# Patient Record
Sex: Male | Born: 1960 | Race: White | Hispanic: No | Marital: Married | Smoking: Never smoker
Health system: Southern US, Community
[De-identification: ages and names within clinical notes are randomized; demographics above are authoritative.]

## PROBLEM LIST (undated history)

## (undated) DIAGNOSIS — Z8719 Personal history of other diseases of the digestive system: Secondary | ICD-10-CM

## (undated) DIAGNOSIS — I4901 Ventricular fibrillation: Secondary | ICD-10-CM

## (undated) DIAGNOSIS — R0602 Shortness of breath: Secondary | ICD-10-CM

## (undated) DIAGNOSIS — I471 Supraventricular tachycardia, unspecified: Secondary | ICD-10-CM

## (undated) DIAGNOSIS — M1990 Unspecified osteoarthritis, unspecified site: Secondary | ICD-10-CM

## (undated) DIAGNOSIS — I319 Disease of pericardium, unspecified: Secondary | ICD-10-CM

## (undated) DIAGNOSIS — R42 Dizziness and giddiness: Secondary | ICD-10-CM

## (undated) DIAGNOSIS — J811 Chronic pulmonary edema: Secondary | ICD-10-CM

## (undated) DIAGNOSIS — I493 Ventricular premature depolarization: Secondary | ICD-10-CM

## (undated) DIAGNOSIS — K753 Granulomatous hepatitis, not elsewhere classified: Secondary | ICD-10-CM

## (undated) DIAGNOSIS — IMO0002 Reserved for concepts with insufficient information to code with codable children: Secondary | ICD-10-CM

## (undated) HISTORY — PX: TONSILLECTOMY: SUR1361

## (undated) HISTORY — DX: Ventricular premature depolarization: I49.3

## (undated) HISTORY — DX: Supraventricular tachycardia: I47.1

## (undated) HISTORY — PX: EXCISIONAL HEMORRHOIDECTOMY: SHX1541

## (undated) HISTORY — PX: LIVER BIOPSY: SHX301

## (undated) HISTORY — DX: Supraventricular tachycardia, unspecified: I47.10

## (undated) HISTORY — DX: Disease of pericardium, unspecified: I31.9

## (undated) HISTORY — DX: Granulomatous hepatitis, not elsewhere classified: K75.3

## (undated) HISTORY — PX: BAND HEMORRHOIDECTOMY: SHX1213

---

## 2005-10-24 ENCOUNTER — Ambulatory Visit: Payer: Self-pay | Admitting: Family Medicine

## 2006-02-13 ENCOUNTER — Ambulatory Visit: Payer: Self-pay | Admitting: Family Medicine

## 2006-10-24 ENCOUNTER — Encounter: Payer: Self-pay | Admitting: Cardiology

## 2006-10-28 ENCOUNTER — Encounter: Admission: RE | Admit: 2006-10-28 | Discharge: 2006-10-28 | Payer: Self-pay | Admitting: Cardiology

## 2007-09-07 ENCOUNTER — Encounter: Admission: RE | Admit: 2007-09-07 | Discharge: 2007-09-07 | Payer: Self-pay | Admitting: Cardiology

## 2010-03-22 ENCOUNTER — Encounter: Admission: RE | Admit: 2010-03-22 | Discharge: 2010-03-22 | Payer: Self-pay | Admitting: Gastroenterology

## 2010-08-25 ENCOUNTER — Other Ambulatory Visit (HOSPITAL_COMMUNITY): Payer: Self-pay | Admitting: Gastroenterology

## 2010-09-01 ENCOUNTER — Other Ambulatory Visit: Payer: Self-pay | Admitting: Interventional Radiology

## 2010-09-01 ENCOUNTER — Ambulatory Visit (HOSPITAL_COMMUNITY)
Admission: RE | Admit: 2010-09-01 | Discharge: 2010-09-01 | Disposition: A | Discharge status: Home / Self Care | Payer: 59 | Source: Ambulatory Visit | Attending: Gastroenterology | Admitting: Gastroenterology

## 2010-09-01 DIAGNOSIS — K759 Inflammatory liver disease, unspecified: Secondary | ICD-10-CM | POA: Insufficient documentation

## 2010-09-01 DIAGNOSIS — Z01812 Encounter for preprocedural laboratory examination: Secondary | ICD-10-CM | POA: Insufficient documentation

## 2010-09-01 LAB — CBC
MCH: 32.9 pg (ref 26.0–34.0)
MCV: 90.2 fL (ref 78.0–100.0)
Platelets: 153 10*3/uL (ref 150–400)
RBC: 4.99 MIL/uL (ref 4.22–5.81)
RDW: 12.7 % (ref 11.5–15.5)

## 2010-09-01 LAB — PROTIME-INR: Prothrombin Time: 13.3 seconds (ref 11.6–15.2)

## 2010-09-16 ENCOUNTER — Encounter: Payer: Self-pay | Admitting: *Deleted

## 2010-09-16 DIAGNOSIS — R5381 Other malaise: Secondary | ICD-10-CM | POA: Insufficient documentation

## 2010-09-16 DIAGNOSIS — R5383 Other fatigue: Secondary | ICD-10-CM

## 2010-09-16 DIAGNOSIS — Z860100 Personal history of colon polyps, unspecified: Secondary | ICD-10-CM | POA: Insufficient documentation

## 2010-09-16 DIAGNOSIS — I319 Disease of pericardium, unspecified: Secondary | ICD-10-CM | POA: Insufficient documentation

## 2010-09-16 DIAGNOSIS — R945 Abnormal results of liver function studies: Secondary | ICD-10-CM | POA: Insufficient documentation

## 2010-09-21 NOTE — Miscellaneous (Signed)
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 takes one hs prn Added new medication of ALEVE 220 MG TABS (NAPROXEN SODIUM) check dose with pt. takes hs prn Added new medication of MULTIVITAMINS  TABS (MULTIPLE VITAMIN) check formulation with pt. takes one per day Allergies: Added new allergy or adverse reaction of VICODIN (HYDROCODONE-ACETAMINOPHEN) Observations: Added new observation of NKA: F (09/16/2010 14:51)

## 2010-09-28 ENCOUNTER — Other Ambulatory Visit: Payer: Self-pay | Admitting: Infectious Diseases

## 2010-09-28 ENCOUNTER — Ambulatory Visit (INDEPENDENT_AMBULATORY_CARE_PROVIDER_SITE_OTHER): Payer: 59 | Admitting: Infectious Diseases

## 2010-09-28 VITALS — BP 108/73 | HR 70 | Temp 98.0°F | Ht 71.0 in | Wt 166.0 lb

## 2010-09-28 DIAGNOSIS — B009 Herpesviral infection, unspecified: Secondary | ICD-10-CM

## 2010-09-28 DIAGNOSIS — K759 Inflammatory liver disease, unspecified: Secondary | ICD-10-CM

## 2010-09-28 DIAGNOSIS — K753 Granulomatous hepatitis, not elsewhere classified: Secondary | ICD-10-CM

## 2010-09-28 MED ORDER — VALACYCLOVIR HCL 500 MG PO TABS: 500.0000 mg | ORAL_TABLET | Freq: Two times a day (BID) | ORAL | Status: AC

## 2010-09-28 NOTE — Progress Notes (Signed)
  Subjective:    Patient ID: Zachary Lara, male    DOB: 07-01-1961, 50 y.o.   MRN: [id number]  HPI 50 yo M with no PMHx who was seen by gastroenterology in December for decreased level of energy beginning in the summer. Has had excessive fatigue, sleeping during the day and poor sleep at night. He lost 15#. He received a trial of Xifaxan for an increase in his Ammonia, without improvement.  He has been also having difficulty with exhaustion and confusion.  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 He had a CT abd showing "scalloping of the liver", liver hilum prominence, ? Of cirrhosis. He underwent a liver Bx on 09-01-10 which showed non-necrotizing granuloma.     Review of Systems  Constitutional: Positive for activity change and unexpected weight change. Negative for fever, chills and appetite change.  Respiratory: Positive for cough. Negative for shortness of breath.   Gastrointestinal: Negative for diarrhea and constipation.  Genitourinary: Negative for dysuria.   Has 2 dog, raised ducks and chickens. Uncle who raised cattle (he did not work with). Grandfather raised rabbits, lived in a camper in [place] 27 years ago. Had a TB skin test 1 year ago. Had a cousin who died of TB (27 years ago). No new meds.     Objective:   Physical Exam  Constitutional: He appears well-developed and well-nourished. No distress.  Eyes: Pupils are equal, round, and reactive to light. No scleral icterus.  Cardiovascular: Normal rate, regular rhythm and normal heart sounds.   Pulmonary/Chest: No respiratory distress. He has no wheezes. He has no rales.  Abdominal: He exhibits no distension. There is no tenderness. There is no rebound.  Musculoskeletal: He exhibits no edema.  Lymphadenopathy:    He has no cervical adenopathy.  Skin: He is not diaphoretic.             Assessment & Plan:  Multiple potential causes for this. WIll screen him for viral pathogens (CMV, HIV), mycobacteria  (TB, MAI), fungi (Toxo,crypto,  histo), unusual bacteria (Brucella, Q fever), autoimmune causes (ANA, sarcoid). Will see him back in 2 weeks.

## 2010-09-28 NOTE — Progress Notes (Signed)
Addended by: Mariea Clonts on: 09/28/2010 05:04 PM   Modules accepted: Orders

## 2010-09-28 NOTE — Progress Notes (Signed)
Addended by: Starleen Arms on: 09/28/2010 03:53 PM   Modules accepted: Orders

## 2010-09-29 LAB — COMPLETE METABOLIC PANEL WITH GFR
ALT: 44 U/L (ref 0–53)
AST: 33 U/L (ref 0–37)
Albumin: 4.4 g/dL (ref 3.5–5.2)
Alkaline Phosphatase: 69 U/L (ref 39–117)
BUN: 11 mg/dL (ref 6–23)
CO2: 24 mEq/L (ref 19–32)
Calcium: 9.1 mg/dL (ref 8.4–10.5)
Chloride: 99 mEq/L (ref 96–112)
Creat: 1.04 mg/dL (ref 0.40–1.50)
GFR, Est African American: >60 mL/min (ref >60)
GFR, Est Non African American: >60 mL/min (ref >60)
Glucose, Bld: 95 mg/dL (ref 70–99)
Potassium: 4.1 mEq/L (ref 3.5–5.3)
Sodium: 141 mEq/L (ref 135–145)
Total Bilirubin: 0.7 mg/dL (ref 0.3–1.2)
Total Protein: 6.9 g/dL (ref 6.0–8.3)

## 2010-09-29 LAB — CBC WITH DIFFERENTIAL/PLATELET

## 2010-09-29 LAB — ANA: Anti Nuclear Antibody(ANA): POSITIVE — AB

## 2010-09-29 LAB — CRYPTOCOCCAL ANTIGEN

## 2010-09-30 LAB — QUANTIFERON TB GOLD ASSAY (BLOOD)
Interferon Gamma Release Assay: NEGATIVE
Mitogen Minus Nil Value: 7.59 IU/mL
Quantiferon Nil Value: 0.14 IU/mL
TB Antigen Minus Nil Value: 0.06 IU/mL

## 2010-09-30 LAB — HIV-1 RNA QUANT-NO REFLEX-BLD: HIV-1 RNA Quant, Log: <1.3 {Log} (ref <1.30)

## 2010-10-01 ENCOUNTER — Other Ambulatory Visit: Payer: Self-pay | Admitting: Infectious Diseases

## 2010-10-01 LAB — CBC WITH DIFFERENTIAL/PLATELET
Basophils Absolute: 0 10*3/uL (ref 0.0–0.1)
Eosinophils Relative: 12 % — ABNORMAL HIGH (ref 0–5)
HCT: 45 % (ref 39.0–52.0)
Hemoglobin: 16.1 g/dL (ref 13.0–17.0)
Lymphocytes Relative: 32 % (ref 12–46)
Lymphs Abs: 2 10*3/uL (ref 0.7–4.0)
MCV: 91.1 fL (ref 78.0–100.0)
Monocytes Absolute: 0.5 10*3/uL (ref 0.1–1.0)
Monocytes Relative: 7 % (ref 3–12)
Neutro Abs: 3 10*3/uL (ref 1.7–7.7)
RBC: 4.94 MIL/uL (ref 4.22–5.81)
WBC: 6.3 10*3/uL (ref 4.0–10.5)

## 2010-10-02 LAB — BRUCELLA IGG, IGM

## 2010-10-04 LAB — CULTURE, BLOOD (SINGLE): Organism ID, Bacteria: NO GROWTH

## 2010-10-12 ENCOUNTER — Ambulatory Visit: Payer: 59 | Admitting: Internal Medicine

## 2010-10-18 ENCOUNTER — Ambulatory Visit: Payer: 59 | Admitting: Infectious Diseases

## 2010-10-21 ENCOUNTER — Ambulatory Visit (INDEPENDENT_AMBULATORY_CARE_PROVIDER_SITE_OTHER): Payer: 59 | Admitting: Infectious Diseases

## 2010-10-21 ENCOUNTER — Encounter: Payer: Self-pay | Admitting: Infectious Diseases

## 2010-10-21 VITALS — BP 114/77 | HR 71 | Temp 97.7°F | Ht 71.0 in | Wt 166.2 lb

## 2010-10-21 DIAGNOSIS — K759 Inflammatory liver disease, unspecified: Secondary | ICD-10-CM

## 2010-10-21 DIAGNOSIS — K753 Granulomatous hepatitis, not elsewhere classified: Secondary | ICD-10-CM

## 2010-10-21 NOTE — Progress Notes (Signed)
Addended by: Starleen Arms on: 10/21/2010 12:41 PM   Modules accepted: Orders

## 2010-10-21 NOTE — Progress Notes (Signed)
Addended by: Starleen Arms on: 10/21/2010 05:17 PM   Modules accepted: Orders

## 2010-10-21 NOTE — Assessment & Plan Note (Addendum)
His Eos raises a a few different pathogens to the DDx- trichenella (he grew up around pigs), tenia solium/cystercercosis (enzyme linked immunotransfer blot assay), and echinococus. His diagnosis still seems elusive as these last 2 diagnosis tend to have more cystic features than granulomatous features. Will check a stool o & p on him as well. Will see him back in 3 weeks. He and his wife are somewhat stressed as she will undergo bone marrow transplant in the next month.

## 2010-10-21 NOTE — Progress Notes (Signed)
  Subjective:    Patient ID: [name], male    DOB: 1961/02/22, 50 y.o.   MRN: [id number]  HPI 50 yo M with no PMHx who was seen by gastroenterology in December for decreased level of energy beginning in the summer. Has had excessive fatigue, sleeping during the day and poor sleep at night. He lost 15#. He received a trial of Xifaxan for an increase in his Ammonia, without improvement.  He has been also having difficulty with exhaustion and confusion.  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 He had a CT abd showing "scalloping of the liver", liver hilum prominence, ? Of cirrhosis. He underwent a liver Bx on 09-01-10 which showed non-necrotizing granuloma.  He was seen in ID clinic 09-28-10 and had negative tests for HIV, Cyrpto, Toxo, Brucella, Quantiferon gold, Q fever. His ANA was positive but the titer was negative.   He did have notable Eosinophilia (12%).  Has previously traveled to [place] 1 week (2004), [place] 2 weeks (1995), [place] 1 WUJW(1191), [place] 1 day (2000?).   Per his spouse he has had a significant amt of wt loss, was previously a Pharmacist, community. He also has severe fatigue. Has had CT scans which he describes as having "blisters", granulomatous hepatitis. States he has also had a MRI of his brain which showed "dots and spots"   Review of Systems  Gastrointestinal: Positive for constipation.       Objective:   Physical Exam  Constitutional: He appears well-developed. He appears cachectic.          Assessment & Plan:

## 2010-10-22 ENCOUNTER — Other Ambulatory Visit (INDEPENDENT_AMBULATORY_CARE_PROVIDER_SITE_OTHER): Payer: 59 | Admitting: Infectious Diseases

## 2010-10-22 DIAGNOSIS — K759 Inflammatory liver disease, unspecified: Secondary | ICD-10-CM

## 2010-10-22 LAB — ANGIOTENSIN CONVERTING ENZYME: Angiotensin-Converting Enzyme: 41 U/L (ref 8–52)

## 2010-10-22 NOTE — Progress Notes (Signed)
Addended by: Mariea Clonts on: 10/22/2010 12:45 PM   Modules accepted: Orders

## 2010-10-25 LAB — OVA AND PARASITE SCREEN: OP: NONE SEEN

## 2010-10-27 LAB — BARTONELLA ANTIBODY PANEL: B henselae IgM: NEGATIVE

## 2010-11-09 LAB — ECHINOCOCCUS ANTIBODY, IGG: Echinococcus Ab: NEGATIVE

## 2010-11-16 ENCOUNTER — Encounter: Payer: Self-pay | Admitting: Infectious Diseases

## 2010-11-16 ENCOUNTER — Ambulatory Visit (INDEPENDENT_AMBULATORY_CARE_PROVIDER_SITE_OTHER): Payer: 59 | Admitting: Infectious Diseases

## 2010-11-16 DIAGNOSIS — K759 Inflammatory liver disease, unspecified: Secondary | ICD-10-CM

## 2010-11-16 DIAGNOSIS — K753 Granulomatous hepatitis, not elsewhere classified: Secondary | ICD-10-CM

## 2010-11-16 NOTE — Assessment & Plan Note (Signed)
The etiology of his syndrome eludes me. His tests are all negative. I will refer him to [facility] for further eval. Discussed possibility that this could be idiopathic/that we may not know or find the answer to this. Will also querry the [facility] regarding his case.  Will see him back as needed.

## 2010-11-16 NOTE — Progress Notes (Signed)
  Subjective:    Patient ID: Zachary Lara, male    DOB: 10-23-1960, 50 y.o.   MRN: [id number]  HPI 50 yo M with no PMHx who was seen by gastroenterology in December for decreased level of energy beginning in the summer. Has had excessive fatigue, sleeping during the day and poor sleep at night. He lost 15#. He received a trial of Xifaxan for an increase in his Ammonia, without improvement.  He has been also having difficulty with exhaustion and confusion.  Has previously traveled to [place] 1 week (2004), [place] 2 weeks (1995), [place] 1 WUJW(1191), [place] 1 day (2000?).  Per his spouse he has had a significant amt of wt loss, was previously a Pharmacist, community. He also has severe fatigue. Has had CT scans which he describes as having "blisters", granulomatous hepatitis. 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 He had a CT abd showing "scalloping of the liver", liver hilum prominence, ? Of cirrhosis. He underwent a liver Bx on 09-01-10 which showed non-necrotizing granuloma.  He was seen in ID clinic 09-28-10 and had negative tests for HIV, Cyrpto, Toxo, Brucella, Quantiferon gold, Q fever. His ANA was positive but the titer was negative.  He did have notable Eosinophilia (12%).  He returned to ID on 10-21-10 and had Cystercercosis/Echinococcus/Trichinella/B Hensella all negative. As well he had stool O & P(-), CMV IgM (-) and ACE-I (-).  His wt has gone up, has been eating more. No change in his level of fatigue. His wife is pending work up for XRT/CTX/bone marrow txp for NHL.    Review of Systems     Objective:   Physical Exam  Constitutional:  Non-toxic appearance. He has a sickly appearance. No distress.            Assessment & Plan:

## 2010-11-30 ENCOUNTER — Telehealth: Payer: Self-pay | Admitting: *Deleted

## 2010-11-30 NOTE — Telephone Encounter (Signed)
Correct number for [facility] GI referral [phone] [name] CMA

## 2010-11-30 NOTE — Telephone Encounter (Signed)
Referral info faxed to [facility] GI and called to inquire about appt date and time.  Was instructed that patient was left message and he needs to call and schedule the appointment.  Gave patient phone number for Eddie North at [facility] [phone]. [name] CMA

## 2010-12-10 ENCOUNTER — Telehealth: Payer: Self-pay | Admitting: *Deleted

## 2010-12-10 NOTE — Telephone Encounter (Signed)
Patient is scheduled for an appointment at [facility] GI clinic for 12/17/10 at 10:00 AM with Dr. Woodfin Ganja.  Patient notified and given address.  He said he is familiar with [facility] hospital.  Patients [facility] MR # [id number] [name] CMA

## 2011-07-12 HISTORY — PX: LUNG SURGERY: SHX703

## 2011-08-19 HISTORY — PX: OTHER SURGICAL HISTORY: SHX169

## 2011-10-03 ENCOUNTER — Telehealth (INDEPENDENT_AMBULATORY_CARE_PROVIDER_SITE_OTHER): Payer: Self-pay

## 2011-10-03 NOTE — Telephone Encounter (Signed)
Pt calling with recurrent internal hemorrhoids.  He has a history of same.  He would like to be seen sooner than his appt date of 11/02/11 because he is so uncomfortable.  I told him I would forward his request to Dr. Ardine Eng nurse.  He can be reached at [phone].

## 2011-10-06 ENCOUNTER — Ambulatory Visit (INDEPENDENT_AMBULATORY_CARE_PROVIDER_SITE_OTHER): Payer: 59 | Admitting: General Surgery

## 2011-10-06 ENCOUNTER — Encounter (INDEPENDENT_AMBULATORY_CARE_PROVIDER_SITE_OTHER): Payer: Self-pay | Admitting: General Surgery

## 2011-10-06 VITALS — BP 110/78 | HR 92 | Temp 98.5°F | Resp 20 | Ht 71.0 in | Wt 164.2 lb

## 2011-10-06 DIAGNOSIS — K648 Other hemorrhoids: Secondary | ICD-10-CM | POA: Insufficient documentation

## 2011-10-06 NOTE — Patient Instructions (Signed)

## 2011-10-06 NOTE — Progress Notes (Signed)
Patient ID: Zachary Lara, male   DOB: [birth year], 52 y.o.   MRN: [id number]  Chief Complaint  Patient presents with  . Rectal Problems    HPI Zachary Lara is a 51 y.o. male.   HPI 51 year old Caucasian male comes in complaining of new hemorrhoidal problems. Apparently he has had hemorrhoidal problems in the past and has been managed by Dr. Gerrit Friends. He states he has had some hemorrhoids banded in the past as well as an excisional hemorrhoidectomy. He states he's been having some problems for the past week and a half. He normally has a bowel movement every day; however, when his hemorrhoids become engorged his bowel movements become less frequent and more irregular. Also when his hemorrhoid it comes engorged he has sharp severe pain when having a bowel movement. He reports a very severe episode of pain and discomfort last Sunday. He normally does not strain. He normally does not sit on the commode for prolonged periods of time. He denies any incontinence. He has had some bleeding over the past several days. He states that his bowel habits get out of sync when his hemorrhoids become engorged. When this happens his bowels become irregular, he has to strain more, and he has to sit for prolonged periods of time. He reports having a normal colonoscopy within the past several years. He eats a high-fiber diet. Past Medical History  Diagnosis Date  . Granulomatous hepatitis     Past Surgical History  Procedure Date  . Pericardial stripping 08/19/11    [facility]  . Excisional hemorrhoidectomy   . Band hemorrhoidectomy     No family history on file.  Social History History  Substance Use Topics  . Smoking status: Never Smoker   . Smokeless tobacco: Never Used  . Alcohol Use: No    Allergies  Allergen Reactions  . Hydrocodone-Acetaminophen     REACTION: check with pt to see what reax occurs    Current Outpatient Prescriptions  Medication Sig Dispense Refill  . eszopiclone (LUNESTA)  2 MG TABS Take 2 mg by mouth at bedtime. Take immediately before bedtime       . valACYclovir (VALTREX) 500 MG tablet Take 500 mg by mouth 2 (two) times daily.        Review of Systems Review of Systems  Constitutional: Negative for fever, chills, appetite change and unexpected weight change.  HENT: Negative for congestion and trouble swallowing.   Eyes: Negative for visual disturbance.  Respiratory: Negative for chest tightness and shortness of breath.   Cardiovascular: Negative for chest pain and leg swelling.       No PND, no orthopnea, no DOE; had open heart surgery at [facility] clinic on 08/19/11 for pericaridal stripping  Gastrointestinal:       See HPI  Genitourinary: Negative for dysuria and hematuria.  Musculoskeletal: Negative.   Skin: Negative for rash.  Neurological: Negative for seizures and speech difficulty.  Hematological: Does not bruise/bleed easily.  Psychiatric/Behavioral: Negative for behavioral problems and confusion.    Blood pressure 110/78, pulse 92, temperature 98.5 F (36.9 C), temperature source Temporal, resp. rate 20, height 5\' 11"  (1.803 m), weight 164 lb 3.2 oz (74.481 kg).  Physical Exam Physical Exam  Vitals reviewed. Constitutional: He is oriented to person, place, and time. He appears well-developed and well-nourished.  HENT:  Head: Normocephalic and atraumatic.  Right Ear: External ear normal.  Left Ear: External ear normal.  Eyes: Conjunctivae are normal. No scleral icterus.  Neck: Normal range  of motion. Neck supple. No JVD present. No tracheal deviation present. No thyromegaly present.  Cardiovascular: Normal rate, regular rhythm and normal heart sounds.   Pulmonary/Chest: Effort normal. No respiratory distress. He has no wheezes.    Abdominal: Soft. Bowel sounds are normal. He exhibits no distension. There is no tenderness.  Genitourinary: Rectal exam shows anal tone normal.       Doesn't really have any prominent ext hemorrhoids. ?sentinel  pile in post midline.-no anal fissure.  Good tone Anoscopy- Rt ant - grade 1 hemorrhoid; rt post - small grade 1 hemorrhoid, left lateral - well healed scar -? Prior excisional hemorrhoid site Has some well healed mucosal scars from what appears to be prior banding.   Musculoskeletal: Normal range of motion. He exhibits no edema and no tenderness.  Neurological: He is alert and oriented to person, place, and time. He exhibits normal muscle tone.  Skin: Skin is warm and dry. No rash noted. He is not diaphoretic. No erythema.  Psychiatric: He has a normal mood and affect. His behavior is normal. Judgment and thought content normal.    Data Reviewed Dr Moshe Cipro note from 1 yr ago  Assessment    Bleeding internal hemorrhoids    Plan    We discussed the etiology of hemorrhoids. The patient was given educational material as well as diagrams. We discussed nonoperative and operative management of hemorrhoidal disease.  We discussed the importance of having a daily soft bowel movement and avoiding constipation. We also discussed good bowel habits such as not reading in the bathroom, not straining, and drinking 6-8 glasses of water per day. We also discussed the importance of a high fiber diet. We discussed foods that were high in fiber as well as fiber supplements. We discussed the importance of trying to get 25-30 g of fiber per day in their diet. We discussed the need to start with a low dose of fiber and then gradually increasing their daily fiber dose over several weeks in order to avoid bloating and cramping.  We then discussed different surgical techniques for hemorrhoids, specifically hemorrhoidal banding and excisional hemorrhoidectomy.  He has pretty good bowel & bathroom habits so he wishes to proceed to the OR  PLAN: proceed to OR for EUA, hemorrhoidal banding &/or excisional hemorrhoidectomy    I discussed the procedure in detail.  The patient was given Agricultural engineer.  We  discussed the risks and benefits of surgery including, but not limited to bleeding, infection, blood clot formation, anesthesia risk, urinary retention, hemorrhoid recurrence, injury to the sphincters resulting in incontinence, and the rare possibility of anal canal narrowing. I explained that the likelihood of improvement of their symptoms is good  We discussed the typical postoperative course.  I stressed the importance of not becoming constipated after surgery.  The patient was encouraged to limit pain medication if possible as this increases the likelihood of becoming constipated. The patient was advised to take stool softners & drink 8-10 glasses of non-carbonated, non-alcoholic beverages per day and to eat a high fiber diet.  I also encouraged soaking in a water warm bath for 15 minutes at a time several times a day and after a bowel movement.  The patient was advised to take laxatives such as milk of magnesia or Miralax if no bowel movement three days after surgery.  The patient was advised to expect some blood tinged drainage as well as some blood in their bowel movements.   He is seeing his [facility] cardiologist this  Tuesday. We will request clearance for surgery.   Mary Sella. Andrey Campanile, MD, FACS General, Bariatric, & Minimally Invasive Surgery [facility], [place]        [facility] M 10/06/2011, 3:30 PM

## 2011-10-14 ENCOUNTER — Encounter (HOSPITAL_COMMUNITY): Payer: Self-pay | Admitting: Pharmacy Technician

## 2011-10-14 ENCOUNTER — Encounter: Payer: Self-pay | Admitting: *Deleted

## 2011-10-14 ENCOUNTER — Other Ambulatory Visit (INDEPENDENT_AMBULATORY_CARE_PROVIDER_SITE_OTHER): Payer: Self-pay | Admitting: General Surgery

## 2011-10-14 DIAGNOSIS — I319 Disease of pericardium, unspecified: Secondary | ICD-10-CM | POA: Insufficient documentation

## 2011-10-17 ENCOUNTER — Encounter (HOSPITAL_COMMUNITY)
Admission: RE | Admit: 2011-10-17 | Discharge: 2011-10-17 | Disposition: A | Discharge status: Home / Self Care | Payer: 59 | Source: Ambulatory Visit | Attending: General Surgery | Admitting: General Surgery

## 2011-10-17 ENCOUNTER — Encounter (HOSPITAL_COMMUNITY): Payer: Self-pay

## 2011-10-17 HISTORY — DX: Unspecified osteoarthritis, unspecified site: M19.90

## 2011-10-17 HISTORY — DX: Ventricular fibrillation: I49.01

## 2011-10-17 HISTORY — DX: Chronic pulmonary edema: J81.1

## 2011-10-17 HISTORY — DX: Dizziness and giddiness: R42

## 2011-10-17 HISTORY — DX: Personal history of other diseases of the digestive system: Z87.19

## 2011-10-17 HISTORY — DX: Shortness of breath: R06.02

## 2011-10-17 HISTORY — DX: Reserved for concepts with insufficient information to code with codable children: IMO0002

## 2011-10-17 LAB — CBC
HCT: 47.8 % (ref 39.0–52.0)
Hemoglobin: 16.5 g/dL (ref 13.0–17.0)
RBC: 5.29 MIL/uL (ref 4.22–5.81)

## 2011-10-17 LAB — DIFFERENTIAL
Lymphocytes Relative: 20 % (ref 12–46)
Lymphs Abs: 1.4 10*3/uL (ref 0.7–4.0)
Monocytes Absolute: 0.7 10*3/uL (ref 0.1–1.0)
Monocytes Relative: 10 % (ref 3–12)
Neutro Abs: 4.3 10*3/uL (ref 1.7–7.7)
Neutrophils Relative %: 61 % (ref 43–77)

## 2011-10-17 LAB — SURGICAL PCR SCREEN
MRSA, PCR: NEGATIVE
Staphylococcus aureus: NEGATIVE

## 2011-10-17 LAB — BASIC METABOLIC PANEL
Chloride: 98 mEq/L (ref 96–112)
GFR calc Af Amer: 81 mL/min — ABNORMAL LOW (ref >90)
GFR calc non Af Amer: 70 mL/min — ABNORMAL LOW (ref >90)
Glucose, Bld: 93 mg/dL (ref 70–99)
Potassium: 4.1 mEq/L (ref 3.5–5.1)
Sodium: 137 mEq/L (ref 135–145)

## 2011-10-17 NOTE — Patient Instructions (Addendum)
20 NOCHOLAS DAMASO  10/17/2011   Your procedure is scheduled on:  10/28/11  Report to SHORT STAY DEPT  at 8:30 AM.  Call this number if you have problems the morning of surgery: [phone]   Remember:   Do not eat food or drink liquids AFTER MIDNIGHT  May have clear liquids UNTIL 6 HOURS BEFORE SURGERY  Clear liquids include soda, tea, black coffee, apple or grape juice, broth.  Take these medicines the morning of surgery with A SIP OF WATER: OMEPRAZOLE / VALTREX   Do not wear jewelry, make-up or nail polish.  Do not wear lotions, powders, or perfumes.   Do not shave legs or underarms 48 hrs. before surgery (men may shave face)  Do not bring valuables to the hospital.  Contacts, dentures or bridgework may not be worn into surgery.  Leave suitcase in the car. After surgery it may be brought to your room.  For patients admitted to the hospital, checkout time is 11:00 AM the day of discharge.   Patients discharged the day of surgery will not be allowed to drive home.  Name and phone number of your driver:   Special Instructions:   Please read over the following fact sheets that you were given: MRSA  Information               SHOWER WITH BETASEPT THE NIGHT BEFORE SURGERY AND THE MORNING OF SURGERY                USE FLEET ENEMA THE MORNING OF SURGERY BEFORE COMING TO HOSPITAL

## 2011-10-20 ENCOUNTER — Ambulatory Visit (HOSPITAL_COMMUNITY): Payer: 59

## 2011-10-24 ENCOUNTER — Ambulatory Visit (HOSPITAL_COMMUNITY): Payer: 59

## 2011-10-26 ENCOUNTER — Ambulatory Visit (HOSPITAL_COMMUNITY): Payer: 59

## 2011-10-27 NOTE — Pre-Procedure Instructions (Signed)
Notes from [facility] and [facility] on chart  CXR done post thorantesis 10/26/11 reviewed by Dr. Shireen Quan - no repeat needed I spoke with pt per phone - he states Dr. Lurline Idol is aware of planned procedure and is cleared Pt is doing well (was walking dogs at time of conversation)

## 2011-10-28 ENCOUNTER — Encounter (HOSPITAL_COMMUNITY): Payer: Self-pay | Admitting: Anesthesiology

## 2011-10-28 ENCOUNTER — Ambulatory Visit (HOSPITAL_COMMUNITY)
Admission: RE | Admit: 2011-10-28 | Discharge: 2011-10-28 | Disposition: A | Discharge status: Home / Self Care | Payer: 59 | Source: Ambulatory Visit | Attending: General Surgery | Admitting: General Surgery

## 2011-10-28 ENCOUNTER — Ambulatory Visit (HOSPITAL_COMMUNITY): Payer: 59

## 2011-10-28 ENCOUNTER — Ambulatory Visit (HOSPITAL_COMMUNITY): Payer: 59 | Admitting: Anesthesiology

## 2011-10-28 ENCOUNTER — Encounter (HOSPITAL_COMMUNITY)
Admission: RE | Disposition: A | Discharge status: Home / Self Care | Payer: Self-pay | Source: Ambulatory Visit | Attending: General Surgery

## 2011-10-28 ENCOUNTER — Encounter (HOSPITAL_COMMUNITY): Payer: Self-pay | Admitting: *Deleted

## 2011-10-28 DIAGNOSIS — Z79899 Other long term (current) drug therapy: Secondary | ICD-10-CM | POA: Insufficient documentation

## 2011-10-28 DIAGNOSIS — K648 Other hemorrhoids: Secondary | ICD-10-CM | POA: Insufficient documentation

## 2011-10-28 DIAGNOSIS — Z01812 Encounter for preprocedural laboratory examination: Secondary | ICD-10-CM | POA: Insufficient documentation

## 2011-10-28 SURGERY — EXAM UNDER ANESTHESIA WITH HEMORRHOIDECTOMY
Anesthesia: General | Site: Anus | Wound class: Contaminated

## 2011-10-28 MED ORDER — OXYCODONE-ACETAMINOPHEN 5-325 MG PO TABS: 1.0000 | ORAL_TABLET | ORAL | Status: AC | PRN

## 2011-10-28 MED ORDER — FENTANYL CITRATE 0.05 MG/ML IJ SOLN
INTRAMUSCULAR | Status: DC | PRN
  Administered 2011-10-28 (×3): 50 ug via INTRAVENOUS

## 2011-10-28 MED ORDER — CEFOXITIN SODIUM 1 G IV SOLR
1.0000 g | INTRAVENOUS | Status: AC
  Administered 2011-10-28: 1 g via INTRAVENOUS
  Filled 2011-10-28: qty 1

## 2011-10-28 MED ORDER — BUPIVACAINE LIPOSOME 1.3 % IJ SUSP
20.0000 mL | Freq: Once | INTRAMUSCULAR | Status: AC
  Administered 2011-10-28: 20 mL
  Filled 2011-10-28: qty 20

## 2011-10-28 MED ORDER — BUPIVACAINE-EPINEPHRINE 0.25% -1:200000 IJ SOLN
INTRAMUSCULAR | Status: DC | PRN
  Administered 2011-10-28: 4 mL

## 2011-10-28 MED ORDER — PROPOFOL 10 MG/ML IV EMUL
INTRAVENOUS | Status: DC | PRN
  Administered 2011-10-28: 150 mg via INTRAVENOUS

## 2011-10-28 MED ORDER — FLEET ENEMA 7-19 GM/118ML RE ENEM: 1.0000 | ENEMA | Freq: Once | RECTAL | Status: DC

## 2011-10-28 MED ORDER — 0.9 % SODIUM CHLORIDE (POUR BTL) OPTIME
TOPICAL | Status: DC | PRN
  Administered 2011-10-28: 1000 mL

## 2011-10-28 MED ORDER — MIDAZOLAM HCL 5 MG/5ML IJ SOLN
INTRAMUSCULAR | Status: DC | PRN
  Administered 2011-10-28: 2 mg via INTRAVENOUS

## 2011-10-28 MED ORDER — LACTATED RINGERS IV SOLN
INTRAVENOUS | Status: DC
  Administered 2011-10-28: 1000 mL via INTRAVENOUS

## 2011-10-28 MED ORDER — BUPIVACAINE-EPINEPHRINE PF 0.25-1:200000 % IJ SOLN
INTRAMUSCULAR | Status: AC
  Filled 2011-10-28: qty 30

## 2011-10-28 SURGICAL SUPPLY — 39 items
BLADE SURG 15 STRL LF DISP TIS (BLADE) ×2 IMPLANT
BLADE SURG 15 STRL SS (BLADE) ×1
BLADE SURG SZ10 CARB STEEL (BLADE) IMPLANT
BRIEF STRETCH FOR OB PAD LRG (UNDERPADS AND DIAPERS) ×3 IMPLANT
CANISTER SUCTION 2500CC (MISCELLANEOUS) ×3 IMPLANT
CLOTH BEACON ORANGE TIMEOUT ST (SAFETY) ×3 IMPLANT
COVER SURGICAL LIGHT HANDLE (MISCELLANEOUS) IMPLANT
DECANTER SPIKE VIAL GLASS SM (MISCELLANEOUS) IMPLANT
DRAPE LAPAROTOMY T 102X78X121 (DRAPES) ×3 IMPLANT
DRAPE LG THREE QUARTER DISP (DRAPES) IMPLANT
DRSG PAD ABDOMINAL 8X10 ST (GAUZE/BANDAGES/DRESSINGS) ×3 IMPLANT
ELECT NEEDLE TIP 2.8 STRL (NEEDLE) IMPLANT
ELECT REM PT RETURN 9FT ADLT (ELECTROSURGICAL) ×3
ELECTRODE REM PT RTRN 9FT ADLT (ELECTROSURGICAL) ×2 IMPLANT
GAUZE SPONGE 4X4 16PLY XRAY LF (GAUZE/BANDAGES/DRESSINGS) ×3 IMPLANT
GLOVE BIO SURGEON STRL SZ7.5 (GLOVE) ×3 IMPLANT
GLOVE BIOGEL M STRL SZ7.5 (GLOVE) IMPLANT
GLOVE BIOGEL PI IND STRL 7.0 (GLOVE) ×2 IMPLANT
GLOVE BIOGEL PI INDICATOR 7.0 (GLOVE) ×1
GLOVE INDICATOR 8.0 STRL GRN (GLOVE) ×3 IMPLANT
GOWN STRL NON-REIN LRG LVL3 (GOWN DISPOSABLE) ×3 IMPLANT
GOWN STRL REIN XL XLG (GOWN DISPOSABLE) ×6 IMPLANT
KIT BASIN OR (CUSTOM PROCEDURE TRAY) ×3 IMPLANT
LUBRICANT JELLY K Y 4OZ (MISCELLANEOUS) ×3 IMPLANT
NDL SAFETY ECLIPSE 18X1.5 (NEEDLE) IMPLANT
NEEDLE HYPO 18GX1.5 SHARP (NEEDLE)
NEEDLE HYPO 25X1 1.5 SAFETY (NEEDLE) ×6 IMPLANT
NS IRRIG 1000ML POUR BTL (IV SOLUTION) ×3 IMPLANT
PACK BASIC VI WITH GOWN DISP (CUSTOM PROCEDURE TRAY) ×3 IMPLANT
PENCIL BUTTON HOLSTER BLD 10FT (ELECTRODE) ×3 IMPLANT
SHEARS HARMONIC 9CM CVD (BLADE) ×6 IMPLANT
SPONGE GAUZE 4X4 12PLY (GAUZE/BANDAGES/DRESSINGS) ×3 IMPLANT
SPONGE HEMORRHOID 8X3CM (HEMOSTASIS) ×3 IMPLANT
SUT CHROMIC 2 0 SH (SUTURE) IMPLANT
SUT CHROMIC 3 0 SH 27 (SUTURE) IMPLANT
SYR CONTROL 10ML LL (SYRINGE) ×6 IMPLANT
TIPS TEFLON (MISCELLANEOUS) ×3 IMPLANT
TOWEL OR 17X26 10 PK STRL BLUE (TOWEL DISPOSABLE) ×6 IMPLANT
YANKAUER SUCT BULB TIP 10FT TU (MISCELLANEOUS) ×3 IMPLANT

## 2011-10-28 NOTE — Progress Notes (Signed)
Patient states compliant with bowel prep as directed by doctor 

## 2011-10-28 NOTE — Interval H&P Note (Signed)
History and Physical Interval Note:  10/28/2011 10:53 AM  [name]  has presented today for surgery, with the diagnosis of bleeding internal hemorrhoids  The various methods of treatment have been discussed with the patient and family. After consideration of risks, benefits and other options for treatment, the patient has consented to  Procedure(s) (LRB): EXAM UNDER ANESTHESIA WITH HEMORRHOIDECTOMY (N/A)  Vs hemorrhoidal banding as a surgical intervention .  The patients' history has been reviewed, patient examined, no change in status, stable for surgery.  I have reviewed the patients' chart and labs.  Questions were answered to the patient's satisfaction.    Had a left thoracentesis on Wednesday for a left pleural effusion at [facility]. Denies any F/C/N/V/SOB. Breathing a lot easier since thoracentesis.   WM in NAD Lungs are CTA b/l Reg  Mary Sella. Andrey Campanile, MD, FACS General, Bariatric, & Minimally Invasive Surgery [facility], [place]    [facility] M

## 2011-10-28 NOTE — Anesthesia Preprocedure Evaluation (Signed)
Anesthesia Evaluation  Patient identified by MRN, date of birth, ID band Patient awake    Reviewed: Allergy & Precautions, H&P , NPO status , Patient's Chart, lab work & pertinent test results  Airway Mallampati: II TM Distance: >3 FB Neck ROM: Full    Dental No notable dental hx.    Pulmonary neg pulmonary ROS,  breath sounds clear to auscultation  Pulmonary exam normal       Cardiovascular + dysrhythmias Atrial Fibrillation Rhythm:Regular Rate:Normal  H/o pericardial calcificaltion s/p pericardectomy at [facility] 2/13. Most recent EF 60%. H/o post op L pleural effusion and thoracentesis 10/24/11 with resolution of SOB.   Neuro/Psych negative neurological ROS  negative psych ROS   GI/Hepatic negative GI ROS, Hepatic congestion secondard to heart condition. Resolved? Negative w/u.   Endo/Other  negative endocrine ROS  Renal/GU negative Renal ROS  negative genitourinary   Musculoskeletal negative musculoskeletal ROS (+)   Abdominal   Peds negative pediatric ROS (+)  Hematology negative hematology ROS (+)   Anesthesia Other Findings   Reproductive/Obstetrics negative OB ROS                           Anesthesia Physical Anesthesia Plan  ASA: III  Anesthesia Plan: General   Post-op Pain Management:    Induction: Intravenous  Airway Management Planned:   Additional Equipment:   Intra-op Plan:   Post-operative Plan: Extubation in OR  Informed Consent: I have reviewed the patients History and Physical, chart, labs and discussed the procedure including the risks, benefits and alternatives for the proposed anesthesia with the patient or authorized representative who has indicated his/her understanding and acceptance.   Dental advisory given  Plan Discussed with: CRNA  Anesthesia Plan Comments:         Anesthesia Quick Evaluation

## 2011-10-28 NOTE — Discharge Instructions (Signed)
[facility] _______[facility]  RECTAL Lara POST OP INSTRUCTIONS: POST OP INSTRUCTIONS  Always review your discharge instruction sheet given to you by the facility where your Lara was performed. IF YOU HAVE DISABILITY OR FAMILY LEAVE FORMS, YOU MUST BRING THEM TO THE OFFICE FOR PROCESSING.   DO NOT GIVE THEM TO YOUR DOCTOR.  1. A  prescription for pain medication may be given to you upon discharge.  Take your pain medication as prescribed, if needed.  If narcotic pain medicine is not needed, then you may take acetaminophen (Tylenol) or ibuprofen (Advil) as needed. 2. Take your usually prescribed medications unless otherwise directed. 3. If you need a refill on your pain medication, please contact your pharmacy.  They will contact our office to request authorization. Prescriptions will not be filled after 5 pm or on week-ends. 4. You should follow a light diet the first 48 hours after arrival home, such as soup and crackers, etc.  Be sure to include lots of fluids daily.  Resume your normal diet 2-3 days after Lara.. 5. Most patients will experience some swelling and discomfort in the rectal area. Ice packs, reclining and warm tub soaks will help.  Swelling and discomfort can take several days to resolve. Soak in a warm water tub for 15-20 minutes three - four times a day and before and after bowel movements. 6. It is common to experience some constipation if taking pain medication after Lara.  Increasing fluid intake and taking a stool softener (such as Colace) will usually help or prevent this problem from occurring.  A mild laxative (Milk of Magnesia or Miralax) should be taken according to package directions if there are no bowel movements after 48 hours. 7. Unless discharge instructions indicate otherwise, leave your bandage dry and in place for 24 hours, or remove the bandage if you have a bowel movement. You may notice a small amount of bleeding with bowel movements for the  first few days. You have some packing in the rectum which will come out over the first day or two. You will need to wear an absorbent pad or soft cotton gauze in your underwear until the drainage stops.it. 8. ACTIVITIES:  You may resume regular (light) daily activities beginning the next day--such as daily self-care, walking, climbing stairs--gradually increasing activities as tolerated.  You may have sexual intercourse when it is comfortable.  Refrain from any heavy lifting or straining until approved by your doctor. a. You may drive when you are no longer taking prescription pain medication, you can comfortably wear a seatbelt, and you can safely maneuver your car and apply brakes. b. RETURN TO WORK: 1 week or sooner c.  9. You should see your doctor in the office for a follow-up appointment approximately 2-3 weeks after your Lara.  Make sure that you call for this appointment within a day or two after you arrive home to insure a convenient appointment time. 10. OTHER INSTRUCTIONS:    WHEN TO CALL YOUR DOCTOR: 1. Fever over 101.0 2. Inability to urinate 3. Nausea and/or vomiting 4. Extreme swelling or bruising 5. Continued bleeding from rectum. 6. Increased pain, redness, or drainage from the incision 7. Constipation  The clinic staff is available to answer your questions during regular business hours.  Please don't hesitate to call and ask to speak to one of the nurses for clinical concerns.  If you have a medical emergency, go to the nearest emergency room or call 911.  A surgeon from [employer]  [place] is always on call at the hospital   [address], [place], [place]  [address] ?  [address] [address], [place], [place]   [address] [phone] ? [phone] ? FAX [phone] Web site: [email or web address]

## 2011-10-28 NOTE — Brief Op Note (Signed)
10/28/2011  12:27 PM  PATIENT:  Zachary Lara  51 y.o. male  PRE-OPERATIVE DIAGNOSIS:  bleeding internal hemorrhoids  POST-OPERATIVE DIAGNOSIS:  Right anterior mixed column hemorrhoid; anterior midline grade 1 internal hemorrhoid; small left anterior grade 1 internal hemorrhoid  PROCEDURE:  Procedure(s) (LRB): EXAM UNDER ANESTHESIA WITH OPEN HEMORRHOIDECTOMY (N/A) HEMORRHOID BANDING (N/A)  SURGEON:  Surgeon(s) and Role:    * Atilano Ina, MD,FACS - Primary  PHYSICIAN ASSISTANT: none  ASSISTANTS: none   ANESTHESIA:   general and 4cc 0.25% marcaine with epi followed by 20cc exparel  EBL:     BLOOD ADMINISTERED:none  DRAINS: none   LOCAL MEDICATIONS USED:  OTHER see above  SPECIMEN:  Source of Specimen:  Rt anterior hemorrhoid  DISPOSITION OF SPECIMEN:  PATHOLOGY  COUNTS:  YES  FINDINGS: pt had essentially circumferentially scarring from prior banding; banding performed in anterior midline; rt anterior mixed column (int/ext) hemorrhoidectomy  TOURNIQUET:  * No tourniquets in log *  DICTATION: .Other Dictation: Dictation Number [id number]  PLAN OF CARE: Discharge to home after PACU  PATIENT DISPOSITION:  PACU - hemodynamically stable.   Delay start of Pharmacological VTE agent (>24hrs) due to surgical blood loss or risk of bleeding: not applicable  Mary Sella. Andrey Campanile, MD, FACS General, Bariatric, & Minimally Invasive Surgery [facility], [place]

## 2011-10-28 NOTE — H&P (View-Only) (Signed)
Patient ID: Zachary Lara, male   DOB: 07/01/1961, 51 y.o.   MRN: [id number]  Chief Complaint  Patient presents with  . Rectal Problems    HPI Zachary Lara is a 51 y.o. male.   HPI 51 year old Caucasian male comes in complaining of new hemorrhoidal problems. Apparently he has had hemorrhoidal problems in the past and has been managed by Dr. Gerrit Friends. He states he has had some hemorrhoids banded in the past as well as an excisional hemorrhoidectomy. He states he's been having some problems for the past week and a half. He normally has a bowel movement every day; however, when his hemorrhoids become engorged his bowel movements become less frequent and more irregular. Also when his hemorrhoid it comes engorged he has sharp severe pain when having a bowel movement. He reports a very severe episode of pain and discomfort last Sunday. He normally does not strain. He normally does not sit on the commode for prolonged periods of time. He denies any incontinence. He has had some bleeding over the past several days. He states that his bowel habits get out of sync when his hemorrhoids become engorged. When this happens his bowels become irregular, he has to strain more, and he has to sit for prolonged periods of time. He reports having a normal colonoscopy within the past several years. He eats a high-fiber diet. Past Medical History  Diagnosis Date  . Granulomatous hepatitis     Past Surgical History  Procedure Date  . Pericardial stripping 08/19/11    [facility]  . Excisional hemorrhoidectomy   . Band hemorrhoidectomy     No family history on file.  Social History History  Substance Use Topics  . Smoking status: Never Smoker   . Smokeless tobacco: Never Used  . Alcohol Use: No    Allergies  Allergen Reactions  . Hydrocodone-Acetaminophen     REACTION: check with pt to see what reax occurs    Current Outpatient Prescriptions  Medication Sig Dispense Refill  . eszopiclone (LUNESTA)  2 MG TABS Take 2 mg by mouth at bedtime. Take immediately before bedtime       . valACYclovir (VALTREX) 500 MG tablet Take 500 mg by mouth 2 (two) times daily.        Review of Systems Review of Systems  Constitutional: Negative for fever, chills, appetite change and unexpected weight change.  HENT: Negative for congestion and trouble swallowing.   Eyes: Negative for visual disturbance.  Respiratory: Negative for chest tightness and shortness of breath.   Cardiovascular: Negative for chest pain and leg swelling.       No PND, no orthopnea, no DOE; had open heart surgery at [facility] clinic on 08/19/11 for pericaridal stripping  Gastrointestinal:       See HPI  Genitourinary: Negative for dysuria and hematuria.  Musculoskeletal: Negative.   Skin: Negative for rash.  Neurological: Negative for seizures and speech difficulty.  Hematological: Does not bruise/bleed easily.  Psychiatric/Behavioral: Negative for behavioral problems and confusion.    Blood pressure 110/78, pulse 92, temperature 98.5 F (36.9 C), temperature source Temporal, resp. rate 20, height 5\' 11"  (1.803 m), weight 164 lb 3.2 oz (74.481 kg).  Physical Exam Physical Exam  Vitals reviewed. Constitutional: He is oriented to person, place, and time. He appears well-developed and well-nourished.  HENT:  Head: Normocephalic and atraumatic.  Right Ear: External ear normal.  Left Ear: External ear normal.  Eyes: Conjunctivae are normal. No scleral icterus.  Neck: Normal range  of motion. Neck supple. No JVD present. No tracheal deviation present. No thyromegaly present.  Cardiovascular: Normal rate, regular rhythm and normal heart sounds.   Pulmonary/Chest: Effort normal. No respiratory distress. He has no wheezes.    Abdominal: Soft. Bowel sounds are normal. He exhibits no distension. There is no tenderness.  Genitourinary: Rectal exam shows anal tone normal.       Doesn't really have any prominent ext hemorrhoids. ?sentinel  pile in post midline.-no anal fissure.  Good tone Anoscopy- Rt ant - grade 1 hemorrhoid; rt post - small grade 1 hemorrhoid, left lateral - well healed scar -? Prior excisional hemorrhoid site Has some well healed mucosal scars from what appears to be prior banding.   Musculoskeletal: Normal range of motion. He exhibits no edema and no tenderness.  Neurological: He is alert and oriented to person, place, and time. He exhibits normal muscle tone.  Skin: Skin is warm and dry. No rash noted. He is not diaphoretic. No erythema.  Psychiatric: He has a normal mood and affect. His behavior is normal. Judgment and thought content normal.    Data Reviewed Dr Moshe Cipro note from 1 yr ago  Assessment    Bleeding internal hemorrhoids    Plan    We discussed the etiology of hemorrhoids. The patient was given educational material as well as diagrams. We discussed nonoperative and operative management of hemorrhoidal disease.  We discussed the importance of having a daily soft bowel movement and avoiding constipation. We also discussed good bowel habits such as not reading in the bathroom, not straining, and drinking 6-8 glasses of water per day. We also discussed the importance of a high fiber diet. We discussed foods that were high in fiber as well as fiber supplements. We discussed the importance of trying to get 25-30 g of fiber per day in their diet. We discussed the need to start with a low dose of fiber and then gradually increasing their daily fiber dose over several weeks in order to avoid bloating and cramping.  We then discussed different surgical techniques for hemorrhoids, specifically hemorrhoidal banding and excisional hemorrhoidectomy.  He has pretty good bowel & bathroom habits so he wishes to proceed to the OR  PLAN: proceed to OR for EUA, hemorrhoidal banding &/or excisional hemorrhoidectomy    I discussed the procedure in detail.  The patient was given Agricultural engineer.  We  discussed the risks and benefits of surgery including, but not limited to bleeding, infection, blood clot formation, anesthesia risk, urinary retention, hemorrhoid recurrence, injury to the sphincters resulting in incontinence, and the rare possibility of anal canal narrowing. I explained that the likelihood of improvement of their symptoms is good  We discussed the typical postoperative course.  I stressed the importance of not becoming constipated after surgery.  The patient was encouraged to limit pain medication if possible as this increases the likelihood of becoming constipated. The patient was advised to take stool softners & drink 8-10 glasses of non-carbonated, non-alcoholic beverages per day and to eat a high fiber diet.  I also encouraged soaking in a water warm bath for 15 minutes at a time several times a day and after a bowel movement.  The patient was advised to take laxatives such as milk of magnesia or Miralax if no bowel movement three days after surgery.  The patient was advised to expect some blood tinged drainage as well as some blood in their bowel movements.   He is seeing his [facility] cardiologist this  Tuesday. We will request clearance for surgery.   Mary Sella. Andrey Campanile, MD, FACS General, Bariatric, & Minimally Invasive Surgery [facility], [place]        [facility] 10/06/2011, 3:30 PM

## 2011-10-28 NOTE — Anesthesia Postprocedure Evaluation (Signed)
  Anesthesia Post-op Note  Patient: Zachary Lara  Procedure(s) Performed: Procedure(s) (LRB): EXAM UNDER ANESTHESIA WITH HEMORRHOIDECTOMY (N/A) HEMORRHOIDECTOMY WITH HEMORRHOID BANDING (N/A)  Patient Location: PACU  Anesthesia Type: General  Level of Consciousness: awake and alert   Airway and Oxygen Therapy: Patient Spontanous Breathing  Post-op Pain: mild  Post-op Assessment: Post-op Vital signs reviewed, Patient's Cardiovascular Status Stable, Respiratory Function Stable, Patent Airway and No signs of Nausea or vomiting  Post-op Vital Signs: stable  Complications: No apparent anesthesia complications

## 2011-10-28 NOTE — Transfer of Care (Signed)
Immediate Anesthesia Transfer of Care Note  Patient: Zachary Lara  Procedure(s) Performed: Procedure(s) (LRB): EXAM UNDER ANESTHESIA WITH HEMORRHOIDECTOMY (N/A) HEMORRHOIDECTOMY WITH HEMORRHOID BANDING (N/A)  Patient Location: PACU  Anesthesia Type: General  Level of Consciousness: sedated and patient cooperative  Airway & Oxygen Therapy: Patient Spontanous Breathing and Patient connected to face mask oxygen  Post-op Assessment: Report given to PACU RN and Post -op Vital signs reviewed and stable  Post vital signs: Reviewed and stable  Complications: No apparent anesthesia complications

## 2011-10-29 NOTE — Op Note (Signed)
NAMEMarland Lara  ANGELL, HONSE NO.:  [id number]  MEDICAL RECORD NO.:  [id number]  LOCATION:  [facility]                         FACILITY:  [facility]  PHYSICIAN:  Mary Sella. Andrey Campanile, MD, FACSDATE OF BIRTH:  08/14/1960  DATE OF PROCEDURE:  10/28/2011 DATE OF DISCHARGE:  10/28/2011                              OPERATIVE REPORT   PREOPERATIVE DIAGNOSIS:  Bleeding internal hemorrhoids.  POSTOPERATIVE DIAGNOSES: 1. Right anterior mixed column hemorrhoid. 2. Anterior midline grade 1 internal hemorrhoid. 3. Small left anterior grade 1 internal hemorrhoid.  PROCEDURE: 1. Exam under anesthesia with open right anterior mixed column     hemorrhoidectomy. 2. Hemorrhoidal banding of anterior midline internal hemorrhoid.  SURGEON:  Mary Sella. Andrey Campanile, MD, FACS.  ANESTHESIA:  General plus local consisting of 0.25% Marcaine with epinephrine followed by 20 cc of Exparel.  ESTIMATED BLOOD LOSS:  Minimal.  COMPLICATIONS:  None immediately apparent.  FINDINGS:  The patient had a broad-based mixed column (external/internal) grade 2 hemorrhoid in the right anterior position. In the anterior midline, he had a grade 1 internal hemorrhoid, which was banded.  In the left anterior position, he had a small broad-based grade 1 internal hemorrhoid.  This area appears somewhat unusual and seems that there was a scar in his left anterior position and it appeared like there is a small tuft of hemorrhoidal tissue on top of the scar.  INDICATIONS FOR PROCEDURE:  The patient is a very pleasant 51 year old Caucasian male, who has a history of hemorrhoidal problems.  He actually has fairly good bowel habits and drinks plenty of water and eats high- fiber diet and does not read on the commode or anything like that.  He has a history of multiple prior bandings as well as an excisional hemorrhoidectomy.  He started to have problems a few weeks ago. Unfortunately, despite this efforts, he is still symptomatic  despite practicing excellent bowel habits.  We discussed observation versus proceeding to the operating room for an exam under anesthesia with potential hemorrhoidal banding versus excisional hemorrhoidectomy.  We discussed the risks and benefits of surgery including, but not limited to bleeding, infection, injury to surrounding structures, hemorrhoidal recurrence, urinary retention, blood clot formation, postoperative pain, anesthesia concerns and risks. He elected to proceed with surgery.  DESCRIPTION OF PROCEDURE:  After obtaining informed consent, the patient was taken back to the operating room #11 at [facility]. General LMA anesthesia was established.  He was placed in lithotomy position with appropriate padding for his legs.  Sequential compression devices had been placed.  His buttocks and anus was prepped with Betadine.  He received antibiotics prior to skin incision.  A surgical time-out was performed.  A digital rectal exam was performed to dilate the anus.  There was no overt palpable hard masses.  In the right anterior position, he had a mixed column internal-external hemorrhoid that was fairly broad-based. He had a small grade 1 internal hemorrhoid in the anterior midline.  As stated above, he had this sort of  thickening right inside the anal canal about 1 cm inside from the anal verge that appeared like a scar and on top of that scar was a few small little tufts  of hemorrhoidal tissue that were not engorged.  I elected to leave that alone.  Two hemorrhoidal bands were placed at the base of the anterior midline grade 1 internal hemorrhoid.  I then turned my attention to the right anterior mixed column hemorrhoid.  Because it was mixed column it was not amenable to banding.  A few cc's of 0.25% Marcaine with epi was infiltrated along the anoderm in the right anterior position.  A V-shaped incision was made with #15 blade.  A hemostat was used to lift up the  hemorrhoidal tissue from the underlying sphincter.  The hemorrhoid was excised in its entirety in elliptical fashion using Harmonic Scalpel.  This was taken up to the dentate line.  There was excellent hemostasis.  Because it was somewhat broad based, I elected to leave it open and let it heal by secondary intention.  I reinspected the rectum with the anoscope.  I saw no significant hemorrhoidal disease left.  Exparel was then infiltrated in a regional fashion.  A piece of Gelfoam was placed in the patient's rectum.  He was taken out of lithotomy position.  All needle, instrument, and sponge counts were correct x2.  He was extubated and taken to recovery room in stable condition.  There are no immediate complications.  The patient tolerated this procedure well.     Mary Sella. Andrey Campanile, MD, FACS     EMW/MEDQ  D:  10/28/2011  T:  10/29/2011  Job:  [id number]

## 2011-10-31 ENCOUNTER — Ambulatory Visit (HOSPITAL_COMMUNITY): Payer: 59

## 2011-11-01 ENCOUNTER — Telehealth (INDEPENDENT_AMBULATORY_CARE_PROVIDER_SITE_OTHER): Payer: Self-pay | Admitting: General Surgery

## 2011-11-01 NOTE — Telephone Encounter (Signed)
Appt sched for 11/11/2011 for late in the day, patient okay with appt date/time.

## 2011-11-01 NOTE — Telephone Encounter (Signed)
Message copied by Liliana Cline on Tue Nov 01, 2011  5:35 PM ------      Message from: Zacarias Pontes      Created: Tue Nov 01, 2011  3:02 PM       Pt has PO apt on 5/3....needs to be rescheduled..wife has chemo this day..please call pt back.Marland KitchenMarland KitchenMarland Kitchen

## 2011-11-02 ENCOUNTER — Ambulatory Visit (HOSPITAL_COMMUNITY): Payer: 59

## 2011-11-02 ENCOUNTER — Ambulatory Visit (INDEPENDENT_AMBULATORY_CARE_PROVIDER_SITE_OTHER): Payer: Self-pay | Admitting: Surgery

## 2011-11-04 ENCOUNTER — Ambulatory Visit (HOSPITAL_COMMUNITY): Payer: 59

## 2011-11-07 ENCOUNTER — Ambulatory Visit (HOSPITAL_COMMUNITY): Payer: 59

## 2011-11-09 ENCOUNTER — Ambulatory Visit (HOSPITAL_COMMUNITY): Payer: 59

## 2011-11-11 ENCOUNTER — Encounter (INDEPENDENT_AMBULATORY_CARE_PROVIDER_SITE_OTHER): Payer: 59 | Admitting: General Surgery

## 2011-11-11 ENCOUNTER — Ambulatory Visit (HOSPITAL_COMMUNITY): Payer: 59

## 2011-11-14 ENCOUNTER — Ambulatory Visit (HOSPITAL_COMMUNITY): Payer: 59

## 2011-11-16 ENCOUNTER — Ambulatory Visit (HOSPITAL_COMMUNITY): Payer: 59

## 2011-11-18 ENCOUNTER — Ambulatory Visit (HOSPITAL_COMMUNITY): Payer: 59

## 2011-11-21 ENCOUNTER — Ambulatory Visit (HOSPITAL_COMMUNITY): Payer: 59

## 2011-11-23 ENCOUNTER — Ambulatory Visit (HOSPITAL_COMMUNITY): Payer: 59

## 2011-11-25 ENCOUNTER — Ambulatory Visit (HOSPITAL_COMMUNITY): Payer: 59

## 2011-11-28 ENCOUNTER — Ambulatory Visit (HOSPITAL_COMMUNITY): Payer: 59

## 2011-11-30 ENCOUNTER — Ambulatory Visit (HOSPITAL_COMMUNITY): Payer: 59

## 2011-12-02 ENCOUNTER — Ambulatory Visit (HOSPITAL_COMMUNITY): Payer: 59

## 2011-12-05 ENCOUNTER — Ambulatory Visit (HOSPITAL_COMMUNITY): Payer: 59

## 2011-12-07 ENCOUNTER — Ambulatory Visit (HOSPITAL_COMMUNITY): Payer: 59

## 2011-12-09 ENCOUNTER — Ambulatory Visit (HOSPITAL_COMMUNITY): Payer: 59

## 2011-12-12 ENCOUNTER — Ambulatory Visit (HOSPITAL_COMMUNITY): Payer: 59

## 2011-12-14 ENCOUNTER — Ambulatory Visit (HOSPITAL_COMMUNITY): Payer: 59

## 2011-12-16 ENCOUNTER — Ambulatory Visit (HOSPITAL_COMMUNITY): Payer: 59

## 2011-12-19 ENCOUNTER — Ambulatory Visit (HOSPITAL_COMMUNITY): Payer: 59

## 2011-12-21 ENCOUNTER — Ambulatory Visit (HOSPITAL_COMMUNITY): Payer: 59

## 2011-12-23 ENCOUNTER — Ambulatory Visit (HOSPITAL_COMMUNITY): Payer: 59

## 2011-12-26 ENCOUNTER — Ambulatory Visit (HOSPITAL_COMMUNITY): Payer: 59

## 2011-12-28 ENCOUNTER — Ambulatory Visit (HOSPITAL_COMMUNITY): Payer: 59

## 2011-12-29 ENCOUNTER — Encounter (INDEPENDENT_AMBULATORY_CARE_PROVIDER_SITE_OTHER): Payer: 59 | Admitting: General Surgery

## 2011-12-29 ENCOUNTER — Encounter (HOSPITAL_COMMUNITY)
Admission: RE | Admit: 2011-12-29 | Discharge: 2011-12-29 | Disposition: A | Discharge status: Home / Self Care | Payer: 59 | Source: Ambulatory Visit | Attending: Cardiology | Admitting: Cardiology

## 2011-12-29 DIAGNOSIS — I319 Disease of pericardium, unspecified: Secondary | ICD-10-CM | POA: Insufficient documentation

## 2011-12-29 DIAGNOSIS — Z5189 Encounter for other specified aftercare: Secondary | ICD-10-CM | POA: Insufficient documentation

## 2011-12-29 DIAGNOSIS — J811 Chronic pulmonary edema: Secondary | ICD-10-CM | POA: Insufficient documentation

## 2011-12-29 DIAGNOSIS — I4891 Unspecified atrial fibrillation: Secondary | ICD-10-CM | POA: Insufficient documentation

## 2011-12-29 NOTE — Progress Notes (Signed)
Cardiac Rehab Medication Review by a Pharmacist  Does the patient  feel that his/her medications are working for him/her?  yes  Has the patient been experiencing any side effects to the medications prescribed?  no  Does the patient measure his/her own blood pressure or blood glucose at home?  no   Does the patient have any problems obtaining medications due to transportation or finances?   no  Understanding of regimen: excellent Understanding of indications: excellent Potential of compliance: excellent    Pharmacist comments: Pt states he feels nauseous and short of breath when he bends over to pet his dogs.  This has been ongoing since his surgery in February.  Symptoms have not improved after decordication surgery in May.      Zachary Lara E 12/29/2011 8:08 AM

## 2011-12-30 ENCOUNTER — Ambulatory Visit (HOSPITAL_COMMUNITY): Payer: 59

## 2012-01-02 ENCOUNTER — Encounter (HOSPITAL_COMMUNITY)
Admission: RE | Admit: 2012-01-02 | Discharge: 2012-01-02 | Disposition: A | Discharge status: Home / Self Care | Payer: 59 | Source: Ambulatory Visit | Attending: Cardiology | Admitting: Cardiology

## 2012-01-02 ENCOUNTER — Ambulatory Visit (HOSPITAL_COMMUNITY): Payer: 59

## 2012-01-03 NOTE — Progress Notes (Signed)
Pt started cardiac rehab today.  Pt tolerated light exercise without difficulty.Telemetry sinus rhythm with t wave inversion.  Vital signs stable. Will continue to monitor the patient throughout  the program.

## 2012-01-04 ENCOUNTER — Encounter (HOSPITAL_COMMUNITY)
Admission: RE | Admit: 2012-01-04 | Discharge: 2012-01-04 | Disposition: A | Discharge status: Home / Self Care | Payer: 59 | Source: Ambulatory Visit | Attending: Cardiology | Admitting: Cardiology

## 2012-01-04 ENCOUNTER — Ambulatory Visit (HOSPITAL_COMMUNITY): Payer: 59

## 2012-01-06 ENCOUNTER — Ambulatory Visit (HOSPITAL_COMMUNITY): Payer: 59

## 2012-01-06 ENCOUNTER — Encounter (HOSPITAL_COMMUNITY)
Admission: RE | Admit: 2012-01-06 | Discharge: 2012-01-06 | Disposition: A | Discharge status: Home / Self Care | Payer: 59 | Source: Ambulatory Visit | Attending: Cardiology | Admitting: Cardiology

## 2012-01-06 NOTE — Progress Notes (Signed)
Reviewed home exercise with pt today.  Pt plans to walk and use exercise bike for exercise.  Reviewed THR, pulse, RPE, sign and symptoms, and when to call 911 or MD.  Pt voiced understanding. Electronically signed by Harriett Sine MS on Friday January 06 2012 at 1529

## 2012-01-09 ENCOUNTER — Ambulatory Visit (HOSPITAL_COMMUNITY): Payer: 59

## 2012-01-09 ENCOUNTER — Encounter (HOSPITAL_COMMUNITY)
Admission: RE | Admit: 2012-01-09 | Discharge: 2012-01-09 | Disposition: A | Discharge status: Home / Self Care | Payer: 59 | Source: Ambulatory Visit | Attending: Cardiology | Admitting: Cardiology

## 2012-01-09 DIAGNOSIS — Z5189 Encounter for other specified aftercare: Secondary | ICD-10-CM | POA: Insufficient documentation

## 2012-01-09 DIAGNOSIS — I319 Disease of pericardium, unspecified: Secondary | ICD-10-CM | POA: Insufficient documentation

## 2012-01-09 DIAGNOSIS — I4891 Unspecified atrial fibrillation: Secondary | ICD-10-CM | POA: Insufficient documentation

## 2012-01-09 DIAGNOSIS — J811 Chronic pulmonary edema: Secondary | ICD-10-CM | POA: Insufficient documentation

## 2012-01-11 ENCOUNTER — Ambulatory Visit (HOSPITAL_COMMUNITY): Payer: 59

## 2012-01-11 ENCOUNTER — Encounter (HOSPITAL_COMMUNITY)
Admission: RE | Admit: 2012-01-11 | Discharge: 2012-01-11 | Disposition: A | Discharge status: Home / Self Care | Payer: 59 | Source: Ambulatory Visit | Attending: Cardiology | Admitting: Cardiology

## 2012-01-13 ENCOUNTER — Encounter (HOSPITAL_COMMUNITY)
Admission: RE | Admit: 2012-01-13 | Discharge: 2012-01-13 | Disposition: A | Discharge status: Home / Self Care | Payer: 59 | Source: Ambulatory Visit | Attending: Cardiology | Admitting: Cardiology

## 2012-01-13 ENCOUNTER — Ambulatory Visit (HOSPITAL_COMMUNITY): Payer: 59

## 2012-01-13 NOTE — Progress Notes (Signed)
Zachary Lara 51 y.o. male       Nutrition Screen                                                                    YES  NO Do you live in a nursing home?  X   Do you eat out more than 3 times/week?    X If yes, how many times per week do you eat out?     Do you have food allergies?   X If yes, what are you allergic to? flaxseed and cotton seed oil  Have you gained or lost more than 10 lbs without trying?               X If yes, how much weight have you lost and over what time period?    Do you want to lose weight?     X If yes, what is a goal weight or amount of weight you would like to lose?   Do you eat alone most of the time?   X   Do you eat less than 2 meals/day?  X If yes, how many meals do you eat?    Do you drink more than 3 alcohol drinks/day?  X If yes, how many drinks per day?   Are you having trouble with constipation? * X  If yes, what are you doing to help relieve constipation? Miralax and water  Do you have financial difficulties with buying food?*    X   Are you experiencing regular nausea/ vomiting?*    X    Do you have a poor appetite? *                                        X   Do you have trouble chewing/swallowing? *   X Food sometimes comes back up   Pt with diagnoses of:  X GERD          X %  Body fat >goal / Body Mass Index >25       Pt Risk Score    12       Diagnosis Risk Score  10       Total Risk Score   22                         High Risk               X Low Risk    HT: 70" Ht Readings from Last 1 Encounters:  12/29/11 5\' 10"  (1.778 m)    WT:   162.4 lb (73.8 kg) Wt Readings from Last 3 Encounters:  12/29/11 162 lb 11.2 oz (73.8 kg)  10/17/11 161 lb (73.029 kg)  10/06/11 164 lb 3.2 oz (74.481 kg)     IBW 73 101%IBW BMI 23.3 22.3%body fat  Meds reviewed: MVI, Vitamin D Past Medical History  Diagnosis Date  . Granulomatous hepatitis   . Pericarditis     H/O, chronic calcific,normal stress cardiolite  10/20/2003 EF 52%  . Ventricular  fibrillation     POST OP PARICARDECTOMY - NO  EPISODES SINCE  . Pulmonary edema     CURRENTLY HAS ABNORMAL CXR - FOLLOWED BY CARDIOLOGIST AT [facility]  . Shortness of breath     WITH EXERTION -   . Dizziness     SINCE TAKING FLUID PILL  . Arthritis   . H/O hiatal hernia   . Ulcer     NON-BLEEDING STOMACH ULCER  . Hemorrhoids        Activity level: Pt is active  Wt goal: 162 lb ( 73.8 kg) Current tobacco use? No Food/Drug Interaction? No Labs:  Lipid Panel  No results found for this basename: chol, trig, hdl, cholhdl, vldl, ldlcalc   No results found for this basename: HGBA1C   10/17/11 Glucose 93  LDL goal: < 100      MI, DM, Carotid or PVD and > 2:      family h/o, > 53 yo male Estimated Daily Nutrition Needs for: ? wt maintenance 2400-2700 Kcal , Total Fat 80-90gm, Saturated Fat 16-18 gm, Trans Fat 2.4-2.7 gm,  Sodium less than 1500 mg

## 2012-01-16 ENCOUNTER — Ambulatory Visit (HOSPITAL_COMMUNITY): Payer: 59

## 2012-01-16 ENCOUNTER — Encounter (HOSPITAL_COMMUNITY): Payer: 59

## 2012-01-16 ENCOUNTER — Encounter (HOSPITAL_COMMUNITY)
Admission: RE | Admit: 2012-01-16 | Discharge: 2012-01-16 | Disposition: A | Discharge status: Home / Self Care | Payer: 59 | Source: Ambulatory Visit | Attending: Cardiology | Admitting: Cardiology

## 2012-01-18 ENCOUNTER — Encounter (HOSPITAL_COMMUNITY)
Admission: RE | Admit: 2012-01-18 | Discharge: 2012-01-18 | Disposition: A | Discharge status: Home / Self Care | Payer: 59 | Source: Ambulatory Visit | Attending: Cardiology | Admitting: Cardiology

## 2012-01-18 ENCOUNTER — Ambulatory Visit (HOSPITAL_COMMUNITY): Payer: 59

## 2012-01-18 NOTE — Progress Notes (Signed)
Zachary Lara 51 y.o. male Nutrition Note Spoke with pt.  Nutrition Survey reviewed with pt. Pt is following Step 2 of the Therapeutic Lifestyle Changes diet. Pt expressed understanding of the information reviewed.  Nutrition Diagnosis   Food-and nutrition-related knowledge deficit related to lack of exposure to information as related to diagnosis of: ? CVD   Nutr RX/ Est Daily Nutr Needs for: ? wt maintenance  2400-2700 Kcal , Total Fat 80-90gm, Saturated Fat 16-18 gm, Trans Fat 2.4-2.7 gm,  Sodium less than 1500 mg  Nutrition Intervention   Benefits of adopting Therapeutic Lifestyle Changes discussed when Medficts reviewed.   Pt to attend the Portion Distortion class   Pt to attend the  ? Nutrition I class                     ? Nutrition II class    Continue client-centered nutrition education by RD, as part of interdisciplinary care. Goal(s)   Pt to describe the benefit of including fruits, vegetables, whole grains, and low-fat dairy products in a heart healthy meal plan. Monitor and Evaluate progress toward nutrition goal with team.

## 2012-01-20 ENCOUNTER — Ambulatory Visit (HOSPITAL_COMMUNITY): Payer: 59

## 2012-01-20 ENCOUNTER — Encounter (HOSPITAL_COMMUNITY)
Admission: RE | Admit: 2012-01-20 | Discharge: 2012-01-20 | Disposition: A | Discharge status: Home / Self Care | Payer: 59 | Source: Ambulatory Visit | Attending: Cardiology | Admitting: Cardiology

## 2012-01-23 ENCOUNTER — Encounter (HOSPITAL_COMMUNITY)
Admission: RE | Admit: 2012-01-23 | Discharge: 2012-01-23 | Disposition: A | Discharge status: Home / Self Care | Payer: 59 | Source: Ambulatory Visit | Attending: Cardiology | Admitting: Cardiology

## 2012-01-23 ENCOUNTER — Ambulatory Visit (HOSPITAL_COMMUNITY): Payer: 59

## 2012-01-23 NOTE — Progress Notes (Signed)
Zachary Lara 51 y.o. male Nutrition Note Spoke with pt.  Nutrition Plan including lipid goals reviewed with pt.  Nutrition Diagnosis   Food-and nutrition-related knowledge deficit related to lack of exposure to information as related to diagnosis of: ? CVD  Nutr RX/ Est Daily Nutr Needs for: ? wt maintenance  2400-2700 Kcal , Total Fat 80-90gm, Saturated Fat 16-18 gm, Trans Fat 2.4-2.7 gm,  Sodium less than 1500 mg  Nutrition Intervention   Pt's individual nutrition plan including cholesterol goals reviewed with pt.   Pt to attend the Portion Distortion class   Pt to attend the  ? Nutrition I class                     ? Nutrition II class    Continue client-centered nutrition education by RD, as part of interdisciplinary care. Goal(s)   Pt to describe the benefit of including fruits, vegetables, whole grains, and low-fat dairy products in a heart healthy meal plan. Monitor and Evaluate progress toward nutrition goal with team.

## 2012-01-25 ENCOUNTER — Encounter (HOSPITAL_COMMUNITY)
Admission: RE | Admit: 2012-01-25 | Discharge: 2012-01-25 | Disposition: A | Discharge status: Home / Self Care | Payer: 59 | Source: Ambulatory Visit | Attending: Cardiology | Admitting: Cardiology

## 2012-01-25 ENCOUNTER — Ambulatory Visit (HOSPITAL_COMMUNITY): Payer: 59

## 2012-01-27 ENCOUNTER — Ambulatory Visit (HOSPITAL_COMMUNITY): Payer: 59

## 2012-01-27 ENCOUNTER — Encounter (HOSPITAL_COMMUNITY)
Admission: RE | Admit: 2012-01-27 | Discharge: 2012-01-27 | Disposition: A | Discharge status: Home / Self Care | Payer: 59 | Source: Ambulatory Visit | Attending: Cardiology | Admitting: Cardiology

## 2012-01-30 ENCOUNTER — Encounter (HOSPITAL_COMMUNITY)
Admission: RE | Admit: 2012-01-30 | Discharge: 2012-01-30 | Disposition: A | Discharge status: Home / Self Care | Payer: 59 | Source: Ambulatory Visit | Attending: Cardiology | Admitting: Cardiology

## 2012-02-01 ENCOUNTER — Encounter (HOSPITAL_COMMUNITY)
Admission: RE | Admit: 2012-02-01 | Discharge: 2012-02-01 | Disposition: A | Discharge status: Home / Self Care | Payer: 59 | Source: Ambulatory Visit | Attending: Cardiology | Admitting: Cardiology

## 2012-02-03 ENCOUNTER — Encounter (HOSPITAL_COMMUNITY)
Admission: RE | Admit: 2012-02-03 | Discharge: 2012-02-03 | Disposition: A | Discharge status: Home / Self Care | Payer: 59 | Source: Ambulatory Visit | Attending: Cardiology | Admitting: Cardiology

## 2012-02-06 ENCOUNTER — Encounter (HOSPITAL_COMMUNITY): Payer: 59

## 2012-02-08 ENCOUNTER — Encounter (HOSPITAL_COMMUNITY): Payer: 59

## 2012-02-10 ENCOUNTER — Encounter (HOSPITAL_COMMUNITY): Payer: 59

## 2012-02-10 ENCOUNTER — Encounter: Payer: Self-pay | Admitting: Family Medicine

## 2012-02-13 ENCOUNTER — Encounter (HOSPITAL_COMMUNITY)
Admission: RE | Admit: 2012-02-13 | Discharge: 2012-02-13 | Disposition: A | Discharge status: Home / Self Care | Payer: 59 | Source: Ambulatory Visit | Attending: Cardiology | Admitting: Cardiology

## 2012-02-13 DIAGNOSIS — I319 Disease of pericardium, unspecified: Secondary | ICD-10-CM | POA: Insufficient documentation

## 2012-02-13 DIAGNOSIS — J811 Chronic pulmonary edema: Secondary | ICD-10-CM | POA: Insufficient documentation

## 2012-02-13 DIAGNOSIS — I4891 Unspecified atrial fibrillation: Secondary | ICD-10-CM | POA: Insufficient documentation

## 2012-02-13 DIAGNOSIS — Z5189 Encounter for other specified aftercare: Secondary | ICD-10-CM | POA: Insufficient documentation

## 2012-02-15 ENCOUNTER — Encounter (HOSPITAL_COMMUNITY)
Admission: RE | Admit: 2012-02-15 | Discharge: 2012-02-15 | Disposition: A | Discharge status: Home / Self Care | Payer: 59 | Source: Ambulatory Visit | Attending: Cardiology | Admitting: Cardiology

## 2012-02-17 ENCOUNTER — Encounter (HOSPITAL_COMMUNITY)
Admission: RE | Admit: 2012-02-17 | Discharge: 2012-02-17 | Disposition: A | Discharge status: Home / Self Care | Payer: 59 | Source: Ambulatory Visit | Attending: Cardiology | Admitting: Cardiology

## 2012-02-20 ENCOUNTER — Encounter (HOSPITAL_COMMUNITY)
Admission: RE | Admit: 2012-02-20 | Discharge: 2012-02-20 | Disposition: A | Discharge status: Home / Self Care | Payer: 59 | Source: Ambulatory Visit | Attending: Cardiology | Admitting: Cardiology

## 2012-02-22 ENCOUNTER — Encounter (HOSPITAL_COMMUNITY)
Admission: RE | Admit: 2012-02-22 | Discharge: 2012-02-22 | Disposition: A | Discharge status: Home / Self Care | Payer: 59 | Source: Ambulatory Visit | Attending: Cardiology | Admitting: Cardiology

## 2012-02-24 ENCOUNTER — Encounter (HOSPITAL_COMMUNITY)
Admission: RE | Admit: 2012-02-24 | Discharge: 2012-02-24 | Disposition: A | Discharge status: Home / Self Care | Payer: 59 | Source: Ambulatory Visit | Attending: Cardiology | Admitting: Cardiology

## 2012-02-27 ENCOUNTER — Encounter (HOSPITAL_COMMUNITY)
Admission: RE | Admit: 2012-02-27 | Discharge: 2012-02-27 | Disposition: A | Discharge status: Home / Self Care | Payer: 59 | Source: Ambulatory Visit | Attending: Cardiology | Admitting: Cardiology

## 2012-02-29 ENCOUNTER — Encounter (HOSPITAL_COMMUNITY)
Admission: RE | Admit: 2012-02-29 | Discharge: 2012-02-29 | Disposition: A | Discharge status: Home / Self Care | Payer: 59 | Source: Ambulatory Visit | Attending: Cardiology | Admitting: Cardiology

## 2012-03-02 ENCOUNTER — Encounter (HOSPITAL_COMMUNITY)
Admission: RE | Admit: 2012-03-02 | Discharge: 2012-03-02 | Disposition: A | Discharge status: Home / Self Care | Payer: 59 | Source: Ambulatory Visit | Attending: Cardiology | Admitting: Cardiology

## 2012-03-05 ENCOUNTER — Encounter (HOSPITAL_COMMUNITY)
Admission: RE | Admit: 2012-03-05 | Discharge: 2012-03-05 | Disposition: A | Discharge status: Home / Self Care | Payer: 59 | Source: Ambulatory Visit | Attending: Cardiology | Admitting: Cardiology

## 2012-03-07 ENCOUNTER — Encounter (HOSPITAL_COMMUNITY)
Admission: RE | Admit: 2012-03-07 | Discharge: 2012-03-07 | Disposition: A | Discharge status: Home / Self Care | Payer: 59 | Source: Ambulatory Visit | Attending: Cardiology | Admitting: Cardiology

## 2012-03-09 ENCOUNTER — Encounter (HOSPITAL_COMMUNITY)
Admission: RE | Admit: 2012-03-09 | Discharge: 2012-03-09 | Disposition: A | Discharge status: Home / Self Care | Payer: 59 | Source: Ambulatory Visit | Attending: Cardiology | Admitting: Cardiology

## 2012-03-12 ENCOUNTER — Encounter (HOSPITAL_COMMUNITY): Payer: 59

## 2012-03-12 DIAGNOSIS — I319 Disease of pericardium, unspecified: Secondary | ICD-10-CM | POA: Insufficient documentation

## 2012-03-12 DIAGNOSIS — I4891 Unspecified atrial fibrillation: Secondary | ICD-10-CM | POA: Insufficient documentation

## 2012-03-12 DIAGNOSIS — Z5189 Encounter for other specified aftercare: Secondary | ICD-10-CM | POA: Insufficient documentation

## 2012-03-12 DIAGNOSIS — J811 Chronic pulmonary edema: Secondary | ICD-10-CM | POA: Insufficient documentation

## 2012-03-14 ENCOUNTER — Encounter (HOSPITAL_COMMUNITY)
Admission: RE | Admit: 2012-03-14 | Discharge: 2012-03-14 | Disposition: A | Discharge status: Home / Self Care | Payer: 59 | Source: Ambulatory Visit | Attending: Cardiology | Admitting: Cardiology

## 2012-03-16 ENCOUNTER — Encounter (HOSPITAL_COMMUNITY)
Admission: RE | Admit: 2012-03-16 | Discharge: 2012-03-16 | Disposition: A | Discharge status: Home / Self Care | Payer: 59 | Source: Ambulatory Visit | Attending: Cardiology | Admitting: Cardiology

## 2012-03-19 ENCOUNTER — Encounter (HOSPITAL_COMMUNITY)
Admission: RE | Admit: 2012-03-19 | Discharge: 2012-03-19 | Disposition: A | Discharge status: Home / Self Care | Payer: 59 | Source: Ambulatory Visit | Attending: Cardiology | Admitting: Cardiology

## 2012-03-21 ENCOUNTER — Encounter (HOSPITAL_COMMUNITY)
Admission: RE | Admit: 2012-03-21 | Discharge: 2012-03-21 | Disposition: A | Discharge status: Home / Self Care | Payer: 59 | Source: Ambulatory Visit | Attending: Cardiology | Admitting: Cardiology

## 2012-03-23 ENCOUNTER — Encounter (HOSPITAL_COMMUNITY): Admission: RE | Admit: 2012-03-23 | Payer: 59 | Source: Ambulatory Visit

## 2012-03-23 ENCOUNTER — Telehealth (HOSPITAL_COMMUNITY): Payer: Self-pay | Admitting: *Deleted

## 2012-03-26 ENCOUNTER — Encounter (HOSPITAL_COMMUNITY): Payer: 59

## 2012-03-28 ENCOUNTER — Encounter (HOSPITAL_COMMUNITY): Payer: 59

## 2012-03-30 ENCOUNTER — Encounter (HOSPITAL_COMMUNITY): Payer: 59

## 2012-04-02 ENCOUNTER — Encounter (HOSPITAL_COMMUNITY): Payer: 59

## 2012-04-04 ENCOUNTER — Encounter (HOSPITAL_COMMUNITY): Payer: 59

## 2012-04-06 ENCOUNTER — Encounter (HOSPITAL_COMMUNITY): Payer: 59

## 2012-04-19 DIAGNOSIS — Z23 Encounter for immunization: Secondary | ICD-10-CM

## 2012-05-13 ENCOUNTER — Ambulatory Visit (INDEPENDENT_AMBULATORY_CARE_PROVIDER_SITE_OTHER): Payer: 59 | Admitting: Family Medicine

## 2012-05-13 VITALS — BP 126/70 | HR 82 | Temp 97.9°F | Resp 16 | Ht 70.0 in | Wt 166.6 lb

## 2012-05-13 DIAGNOSIS — W57XXXA Bitten or stung by nonvenomous insect and other nonvenomous arthropods, initial encounter: Secondary | ICD-10-CM

## 2012-05-13 DIAGNOSIS — T148 Other injury of unspecified body region: Secondary | ICD-10-CM

## 2012-05-13 MED ORDER — DOXYCYCLINE HYCLATE 100 MG PO TABS: 100.0000 mg | ORAL_TABLET | Freq: Two times a day (BID) | ORAL | Status: DC

## 2012-05-13 NOTE — Patient Instructions (Addendum)
Deer Tick Bite Deer ticks are brown arachnids (spider family) that vary in size from as small as the head of a pin to 1/4 inch (1/2 cm) diameter. They thrive in wooded areas. Deer are the preferred host of adult deer ticks. Small rodents are the host of young ticks (nymphs). When a person walks in a field or wooded area, young and adult ticks in the surrounding grass and vegetation can attach themselves to the skin. They can suck blood for hours to days if unnoticed. Ticks are found all over the [place] Some ticks carry a specific bacteria (Borrelia burgdorferi) that causes an infection called Lyme disease. The bacteria is typically passed into a person during the blood sucking process. This happens after the tick has been attached for at least a number of hours. While ticks can be found all over the [place] those carrying the bacteria that causes Lyme disease are most common in [place] and the [place]. Only a small proportion of ticks in these areas carry the Lyme disease bacteria and cause human infections. Ticks usually attach to warm spots on the body, such as the:  Head.  Back.  Neck.  Armpits.  Groin. SYMPTOMS  Most of the time, a deer tick bite will not be felt. You may or may not see the attached tick. You may notice mild irritation or redness around the bite site. If the deer tick passes the Lyme disease bacteria to a person, a round, red rash may be noticed 2 to 3 days after the bite. The rash may be clear in the middle, like a bull's-eye or target. If not treated, other symptoms may develop several days to weeks after the onset of the rash. These symptoms may include:  New rash lesions.  Fatigue and weakness.  General ill feeling and achiness.  Chills.  Headache and neck pain.  Swollen lymph glands.  Sore muscles and joints. 5 to 15% of untreated people with Lyme disease may develop more severe illnesses after several weeks to months. This may include inflammation of the  brain lining (meningitis), nerve palsies, an abnormal heartbeat, or severe muscle and joint pain and inflammation (myositis or arthritis). DIAGNOSIS   Physical exam and medical history.  Viewing the tick if it was saved for confirmation.  Blood tests (to check or confirm the presence of Lyme disease). TREATMENT  Most ticks do not carry disease. If found, an attached tick should be removed using tweezers. Tweezers should be placed under the body of the tick so it is removed by its attachment parts (pincers). If there are signs or symptoms of being sick, or Lyme disease is confirmed, medicines (antibiotics) that kill germs are usually prescribed. In more severe cases, antibiotics may be given through an intravenous (IV) access. HOME CARE INSTRUCTIONS   Always remove ticks with tweezers. Do not use petroleum jelly or other methods to kill or remove the tick. Slide the tweezers under the body and pull out as much as you can. If you are not sure what it is, save it in a jar and show your caregiver.  Once you remove the tick, the skin will heal on its own. Wash your hands and the affected area with water and soap. You may place a bandage on the affected area.  Take medicine as directed. You may be advised to take a full course of antibiotics.  Follow up with your caregiver as recommended. FINDING OUT THE RESULTS OF YOUR TEST Not all test results are available   be advised to take a full course of antibiotics.   Follow up with your caregiver as recommended.  FINDING OUT THE RESULTS OF YOUR TEST  Not all test results are available during your visit. If your test results are not back during the visit, make an appointment with your caregiver to find out the results. Do not assume everything is normal if you have not heard from your caregiver or the medical facility. It is important for you to follow up on all of your test results.  PROGNOSIS   If Lyme disease is confirmed, early treatment with antibiotics is very effective. Following preventive guidelines is important since it is possible to get the disease more than once.  PREVENTION    Wear long sleeves and long pants in  wooded or grassy areas. Tuck your pants into your socks.   Use an insect repellent while hiking.   Check yourself, your children, and your pets regularly for ticks after playing outside.   Clear piles of leaves or brush from your yard. Ticks might live there.  SEEK MEDICAL CARE IF:    You or your child has an oral temperature above 102 F (38.9 C).   You develop a severe headache following the bite.   You feel generally ill.   You notice a rash.   You are having trouble removing the tick.   The bite area has red skin or yellow drainage.  SEEK IMMEDIATE MEDICAL CARE IF:    Your face is weak and droopy or you have other neurological symptoms.   You have severe joint pain or weakness.  MAKE SURE YOU:    Understand these instructions.   Will watch your condition.   Will get help right away if you are not doing well or get worse.  FOR MORE INFORMATION  Centers for Disease Control and Prevention: www.cdc.gov  American Academy of Family Physicians: www.aafp.org  Document Released: 09/21/2009 Document Revised: 09/19/2011 Document Reviewed: 09/21/2009  ExitCare Patient Information 2013 [employer].

## 2012-05-13 NOTE — Progress Notes (Signed)
51 yo with tick bite 1 week ago, wife removed the tick.  The  Area on his right  Scapula has become red and ulcerated and persistent.  No fever, headache, or rash  Objective:  NAD Neck supple 3 mm erythema with 1 mm central yellow crusted ulceration Some erythema where bandaid adhesive was applied  Assessment:  Infected tick bite, latex allergy  Plan:  No bandaids

## 2012-11-01 ENCOUNTER — Other Ambulatory Visit: Payer: Self-pay | Admitting: Family Medicine

## 2013-04-19 ENCOUNTER — Ambulatory Visit (INDEPENDENT_AMBULATORY_CARE_PROVIDER_SITE_OTHER): Payer: 59

## 2013-04-19 DIAGNOSIS — Z23 Encounter for immunization: Secondary | ICD-10-CM

## 2013-06-19 ENCOUNTER — Encounter: Payer: Self-pay | Admitting: Cardiology

## 2014-04-02 ENCOUNTER — Other Ambulatory Visit: Payer: Self-pay | Admitting: Family Medicine

## 2014-04-02 DIAGNOSIS — R109 Unspecified abdominal pain: Secondary | ICD-10-CM

## 2014-04-14 ENCOUNTER — Ambulatory Visit
Admission: RE | Admit: 2014-04-14 | Discharge: 2014-04-14 | Disposition: A | Discharge status: Home / Self Care | Payer: 59 | Source: Ambulatory Visit | Attending: Family Medicine | Admitting: Family Medicine

## 2014-04-14 DIAGNOSIS — R109 Unspecified abdominal pain: Secondary | ICD-10-CM

## 2014-04-18 ENCOUNTER — Telehealth: Payer: Self-pay | Admitting: Hematology and Oncology

## 2014-04-18 NOTE — Telephone Encounter (Signed)
LEFT MESSAGE FOR PATIENT AND GAVE NP APPT FOR 10/20 @ 1:30 W/DR. GORSUCH. LEFT CONTACT INFORMATION FOR PATIENT TO RETURN CALL AN CONFIRM MESSAGE/NP APPT.

## 2014-04-29 ENCOUNTER — Ambulatory Visit (HOSPITAL_BASED_OUTPATIENT_CLINIC_OR_DEPARTMENT_OTHER): Payer: 59 | Admitting: Hematology and Oncology

## 2014-04-29 ENCOUNTER — Ambulatory Visit: Payer: 59

## 2014-04-29 ENCOUNTER — Encounter: Payer: Self-pay | Admitting: Hematology and Oncology

## 2014-04-29 VITALS — BP 111/62 | HR 72 | Temp 98.2°F | Resp 20 | Ht 70.0 in | Wt 178.4 lb

## 2014-04-29 DIAGNOSIS — K753 Granulomatous hepatitis, not elsewhere classified: Secondary | ICD-10-CM

## 2014-04-29 DIAGNOSIS — D721 Eosinophilia, unspecified: Secondary | ICD-10-CM | POA: Insufficient documentation

## 2014-04-29 NOTE — Progress Notes (Signed)
[facility] CONSULT NOTE  Patient has no care team.  CHIEF COMPLAINTS/PURPOSE OF CONSULTATION:  Chronic eosinophilia  HISTORY OF PRESENTING ILLNESS:  Zachary Lara 53 y.o. male is here because of elevated eosinophil count.  He was found to have abnormal CBC from 2012 with eosinophil percentage ranging from 8% to 21% He denies recent infection. The last prescription antibiotics was more than 3 months ago. He has chronic allergic rhinitis at least twice a year during the spring and the fall and recurrent dermatitis affecting his anterior shins. There is not reported symptoms of cough, diarrhea, joint swelling/pain and abnormal weight loss.  The patient had history of pericarditis and underwent pericardectomy. He had history of mild cardiac congestion causing abnormal changes in his liver function and had liver biopsy which was abnormal. He does not follow with a gastroenterologist.  He had no prior history or diagnosis of cancer. His age appropriate screening programs are up-to-date. The patient has no prior diagnosis of autoimmune disease and was not prescribed corticosteroids related products. He had skin allergy testing performed 4 years ago by his allergist and was told he is allergic to grass and trees. He has a dog at home but no other pets MEDICAL HISTORY:  Past Medical History  Diagnosis Date  . Granulomatous hepatitis   . Pericarditis     H/O, chronic calcific,normal stress cardiolite  10/20/2003 EF 52%  . Ventricular fibrillation     POST OP PARICARDECTOMY - NO EPISODES SINCE  . Pulmonary edema     CURRENTLY HAS ABNORMAL CXR - FOLLOWED BY CARDIOLOGIST AT [facility]  . Shortness of breath     WITH EXERTION -   . Dizziness     SINCE TAKING FLUID PILL  . Arthritis   . H/O hiatal hernia   . Ulcer     NON-BLEEDING STOMACH ULCER  . Hemorrhoids     SURGICAL HISTORY: Past Surgical History  Procedure Laterality Date  . Pericardial stripping  08/19/11    [facility]   . Excisional hemorrhoidectomy    . Band hemorrhoidectomy    . Tonsillectomy    . Liver biopsy    . Lung surgery  2013    SOCIAL HISTORY: History   Social History  . Marital Status: Married    Spouse Name: N/A    Number of Children: N/A  . Years of Education: N/A   Occupational History  . Not on file.   Social History Main Topics  . Smoking status: Never Smoker   . Smokeless tobacco: Never Used  . Alcohol Use: No  . Drug Use: No  . Sexual Activity: Yes    Partners: Female   Other Topics Concern  . Not on file   Social History Narrative  . No narrative on file    FAMILY HISTORY: Family History  Problem Relation Age of Onset  . Heart attack Mother     multiple    ALLERGIES:  is allergic to bio-flax; cottonseed oil; and hydrocodone-acetaminophen.  MEDICATIONS:  Current Outpatient Prescriptions  Medication Sig Dispense Refill  . b complex vitamins capsule Take by mouth.      . fish oil-omega-3 fatty acids 1000 MG capsule Take 2 g by mouth daily.      . Multiple Vitamin (MULITIVITAMIN WITH MINERALS) TABS Take 1 tablet by mouth daily with breakfast.      . omeprazole (PRILOSEC) 20 MG capsule Take 20 mg by mouth daily.       . valACYclovir (VALTREX) 500  MG tablet Take 500 mg by mouth daily.        No current facility-administered medications for this visit.    REVIEW OF SYSTEMS:   Constitutional: Denies fevers, chills or abnormal night sweats Eyes: Denies blurriness of vision, double vision or watery eyes Ears, nose, mouth, throat, and face: Denies mucositis or sore throat Respiratory: Denies cough, dyspnea or wheezes Cardiovascular: Denies palpitation, chest discomfort or lower extremity swelling Gastrointestinal:  Denies nausea, heartburn or change in bowel habits Lymphatics: Denies new lymphadenopathy or easy bruising Neurological:Denies numbness, tingling or new weaknesses Behavioral/Psych: Mood is stable, no new changes  All other systems were reviewed  with the patient and are negative.  PHYSICAL EXAMINATION: ECOG PERFORMANCE STATUS: 1 - Symptomatic but completely ambulatory  Filed Vitals:   04/29/14 1403  BP: 111/62  Pulse: 72  Temp: 98.2 F (36.8 C)  Resp: 20   Filed Weights   04/29/14 1403  Weight: 178 lb 6.4 oz (80.922 kg)    GENERAL:alert, no distress and comfortable SKIN: Mild dermatitis on the anterior shin areas. EYES: normal, conjunctiva are pink and non-injected, sclera clear OROPHARYNX:no exudate, no erythema and lips, buccal mucosa, and tongue normal  NECK: supple, thyroid normal size, non-tender, without nodularity LYMPH:  no palpable lymphadenopathy in the cervical, axillary or inguinal LUNGS: clear to auscultation and percussion with normal breathing effort HEART: regular rate & rhythm and no murmurs and no lower extremity edema ABDOMEN:abdomen soft, non-tender and normal bowel sounds Musculoskeletal:no cyanosis of digits and no clubbing  PSYCH: alert & oriented x 3 with fluent speech NEURO: no focal motor/sensory deficits  LABORATORY DATA:  I have reviewed the data as listed  RADIOGRAPHIC STUDIES:. [place] Abdomen Complete  04/14/2014   CLINICAL DATA:  Upper abdominal discomfort, primarily on the left; either sent if ileus ; previously noted changes of cirrhosis  EXAM: ULTRASOUND ABDOMEN COMPLETE  COMPARISON:  CT abdomen and pelvis May 22, 2010  FINDINGS: Gallbladder: No gallstones or wall thickening visualized. There is no pericholecystic fluid. No sonographic Murphy sign noted.  Common bile duct: Diameter: 4 mm. There is no intrahepatic, common hepatic, or common bile duct dilatation.  Liver: No focal lesion identified. The liver echotexture is rather coarse. The liver contour is somewhat nodular suggesting underlying cirrhosis.  IVC: No abnormality visualized.  Pancreas: Visualized portion unremarkable. Portions of pancreas are obscured by gas.  Spleen: Size and appearance within normal limits. Spleen measures  12.4 x 12.2 x 4.3 cm with a measured volume of 325 cubic cm.  Right Kidney: Length: 11.3 cm. Echogenicity within normal limits. No mass or hydronephrosis visualized.  Left Kidney: Length: 12.3 cm. Echogenicity within normal limits. No mass or hydronephrosis visualized.  Abdominal aorta: No aneurysm visualized.  Other findings: No demonstrable ascites.  IMPRESSION: Liver has an appearance suggesting underlying cirrhosis. No focal liver lesions are appreciable.  Portions of pancreas obscured by gas. Visualized portions of pancreas appear normal.  Study otherwise unremarkable.   Electronically Signed   By: Bretta BangWilliam  Woodruff M.D.   On: 04/14/2014 09:08    ASSESSMENT & PLAN Eosinophilia The significant eosinophilia seen on his blood work over the past 3-1/2 years are likely reactive in nature. The patient had confirmed allergic reaction to grass and trees from prior skin test performed by his allergist. Overall, I felt that his diagnosis is more consistent with allergic type reaction rather than hypereosinophilic syndrome. He does not fulfill the criteria for myeloproliferative disorder and his absolute eosinophil count has never been over 1.5.  I reassured the patient. I recommend over-the-counter allergy medicine. I recommend he changes his environment as much as possible (such as changing his carpeting every 5 years) and revisit with his allergist if he's allergic symptoms get worse. He has occasional chronic allergic rhinitis and dermatitis which I think easily treatable with over-the-counter allergy medicine. I do not feel strongly we need to order accessible work to exclude myeloproliferative disorder. I reassured him and his wife.   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 I recommend close followup with PCP and avoiding excessive alcohol intake.

## 2014-04-29 NOTE — Assessment & Plan Note (Signed)
The significant eosinophilia seen on his blood work over the past 3-1/2 years are likely reactive in nature. The patient had confirmed allergic reaction to grass and trees from prior skin test performed by his allergist. Overall, I felt that his diagnosis is more consistent with allergic type reaction rather than hypereosinophilic syndrome. He does not fulfill the criteria for myeloproliferative disorder and his absolute eosinophil count has never been over 1.5. I reassured the patient. I recommend over-the-counter allergy medicine. I recommend he changes his environment as much as possible (such as changing his carpeting every 5 years) and revisit with his allergist if he's allergic symptoms get worse. He has occasional chronic allergic rhinitis and dermatitis which I think easily treatable with over-the-counter allergy medicine. I do not feel strongly we need to order accessible work to exclude myeloproliferative disorder. I reassured him and his wife.

## 2014-04-29 NOTE — Assessment & Plan Note (Signed)
The patient had come from liver biopsy and recent ultrasound which showed early signs of cirrhosis. I recommend close followup with PCP and avoiding excessive alcohol intake.

## 2015-08-27 ENCOUNTER — Encounter: Payer: Self-pay | Admitting: Neurology

## 2015-08-27 ENCOUNTER — Ambulatory Visit (INDEPENDENT_AMBULATORY_CARE_PROVIDER_SITE_OTHER): Payer: 59 | Admitting: Neurology

## 2015-08-27 VITALS — BP 128/81 | HR 77 | Ht 70.0 in | Wt 176.4 lb

## 2015-08-27 DIAGNOSIS — H93A9 Pulsatile tinnitus, unspecified ear: Secondary | ICD-10-CM | POA: Diagnosis not present

## 2015-08-27 DIAGNOSIS — R200 Anesthesia of skin: Secondary | ICD-10-CM

## 2015-08-27 DIAGNOSIS — H539 Unspecified visual disturbance: Secondary | ICD-10-CM | POA: Diagnosis not present

## 2015-08-27 DIAGNOSIS — R51 Headache: Secondary | ICD-10-CM | POA: Diagnosis not present

## 2015-08-27 DIAGNOSIS — R519 Headache, unspecified: Secondary | ICD-10-CM

## 2015-08-27 NOTE — Progress Notes (Signed)
[facility]    Provider:  Dr Lucia Gaskins Referring Provider: Gildardo Cranker, MD Primary Care Physician:  No primary care provider on file.  CC:  Headache  HPI:  Zachary Lara is a 55 y.o. male here as a referral from Dr. Tenny Craw for headache.PMHx IBS, herpes, gerd, eosinophilia, dysphagia, Hx of pericarditis. In 2013 had open heart surgery and was taking supplements and started taking supplements that increased bolod pressure. He has stopped. About 6 months ago noticed numbness above the right ear like he slept on a rock. No inciting events, no trauma. Never had headaches in the past. Is continuous. Stopped taking the supplements. He describes it as pressure. No tingling or burning or radiation but it has become more diffuse. No  ear pain. It is continuous. He feels more pressure during exercise. No light sensitivity, no sound sensitivity, no vomiting. He has vision changes some stars in his vision and times in the day when his vision is worse for unknown reason. No nausea or vomiting. No fevers, neck pain, or other systemic signs. No meningismus. He also describes a heart beating/ringing in his ears.    Review of Systems: Patient complains of symptoms per HPI as well as the following symptoms: No cp, no sob, no fever . Pertinent negatives per HPI. All others negative.   Social History   Social History  . Marital Status: Married    Spouse Name: [name]  . Number of Children: N/A  . Years of Education: 16   Occupational History  . [employer]    Social History Main Topics  . Smoking status: Never Smoker   . Smokeless tobacco: Never Used  . Alcohol Use: No  . Drug Use: No  . Sexual Activity:    Partners: Female   Other Topics Concern  . Not on file   Social History Narrative   Lives with wife   Caffeine use: 2 cups coffee every morning       Family History  Problem Relation Age of Onset  . Heart attack Mother     multiple    Past Medical History    Diagnosis Date  . Granulomatous hepatitis   . Pericarditis     H/O, chronic calcific,normal stress cardiolite  10/20/2003 EF 52%  . Ventricular fibrillation (HCC)     POST OP PARICARDECTOMY - NO EPISODES SINCE  . Pulmonary edema     CURRENTLY HAS ABNORMAL CXR - FOLLOWED BY CARDIOLOGIST AT [facility]  . Shortness of breath     WITH EXERTION -   . Dizziness     SINCE TAKING FLUID PILL  . Arthritis   . H/O hiatal hernia   . Ulcer     NON-BLEEDING STOMACH ULCER  . Hemorrhoids     Past Surgical History  Procedure Laterality Date  . Pericardial stripping  08/19/11    [facility]  . Excisional hemorrhoidectomy    . Band hemorrhoidectomy    . Tonsillectomy    . Liver biopsy    . Lung surgery  2013    Current Outpatient Prescriptions  Medication Sig Dispense Refill  . acyclovir (ZOVIRAX) 400 MG tablet Take 400 mg by mouth.    . fish oil-omega-3 fatty acids 1000 MG capsule Take 2 g by mouth daily.    . Multiple Vitamin (MULITIVITAMIN WITH MINERALS) TABS Take 1 tablet by mouth daily with breakfast.     No current facility-administered medications for this visit.    Allergies as of 08/27/2015 - Review  Complete 08/27/2015  Allergen Reaction Noted  . Bio-flax Hives 10/14/2011  . Cottonseed oil Hives 10/14/2011  . Other  08/27/2015    Vitals: BP 128/81 mmHg  Pulse 77  Ht  (1.778 m)  Wt 176 lb 6.4 oz (80.015 kg)  BMI 25.31 kg/m2 Last Weight:  Wt Readings from Last 1 Encounters:  08/27/15 176 lb 6.4 oz (80.015 kg)   Last Height:   Ht Readings from Last 1 Encounters:  08/27/15  (1.778 m)   Physical exam: Exam: Gen: NAD, conversant                  CV: RRR, no MRG. No Carotid Bruits. No peripheral edema, warm, nontender Eyes: Conjunctivae clear without exudates or hemorrhage  Neuro: Detailed Neurologic Exam  Speech:    Speech is normal; fluent and spontaneous with normal comprehension.  Cognition:    The patient is oriented to person, place, and time;      recent and remote memory intact;     language fluent;     normal attention, concentration,     fund of knowledge Cranial Nerves:    The pupils are equal, round, and reactive to light. The fundi are normal and spontaneous venous pulsations are present. Visual fields are full to finger confrontation. Extraocular movements are intact. Trigeminal sensation is intact and the muscles of mastication are normal. The face is symmetric. The palate elevates in the midline. Hearing intact. Voice is normal. Shoulder shrug is normal. The tongue has normal motion without fasciculations.   Coordination:    Normal finger to nose and heel to shin. Normal rapid alternating movements.   Gait:    Heel-toe and tandem gait are normal.   Motor Observation:    No asymmetry, no atrophy, and no involuntary movements noted. Tone:    Normal muscle tone.    Posture:    Posture is normal. normal erect    Strength:    Strength is V/V in the upper and lower limbs.      Sensation: intact to LT     Reflex Exam:  DTR's:    Deep tendon reflexes in the upper and lower extremities are brisk bilaterally.   Toes:    The toes are downgoing bilaterally.   Clonus:    3 beats left AJ. .    Assessment/Plan:  55 year old male with new onset headache continuous for 6 months, vision changes, pulsatile tinnitus without h/o headaches. Exam essentially normal except for very brisk reflexes and 3 beats on left AJ. Will order MRI of the brain and MRA of the head.   CC: Dr. Tenny Craw.   Naomie Dean, MD  [facility] [address] [place], [place] [address]  Phone [phone] Fax [phone]

## 2015-08-27 NOTE — Patient Instructions (Signed)
Remember to drink plenty of fluid, eat healthy meals and do not skip any meals. Try to eat protein with a every meal and eat a healthy snack such as fruit or nuts in between meals. Try to keep a regular sleep-wake schedule and try to exercise daily, particularly in the form of walking, 20-30 minutes a day, if you can.   As far as diagnostic testing: MRI of the brain and MRA of the head  I would like to see you back after imaging if needed, sooner if we need to. Please call us with any interim questions, concerns, problems, updates or refill requests.   Our phone number is [phone]. We also have an after hours call service for urgent matters and there is a physician on-call for urgent questions. For any emergencies you know to call 911 or go to the nearest emergency room

## 2015-08-28 ENCOUNTER — Telehealth: Payer: Self-pay | Admitting: *Deleted

## 2015-08-28 LAB — BASIC METABOLIC PANEL
BUN / CREAT RATIO: 22 — AB (ref 9–20)
BUN: 21 mg/dL (ref 6–24)
CO2: 25 mmol/L (ref 18–29)
CREATININE: 0.95 mg/dL (ref 0.76–1.27)
Calcium: 9.3 mg/dL (ref 8.7–10.2)
Chloride: 99 mmol/L (ref 96–106)
GFR, EST AFRICAN AMERICAN: 102 mL/min/{1.73_m2} (ref >59)
GFR, EST NON AFRICAN AMERICAN: 88 mL/min/{1.73_m2} (ref >59)
Glucose: 89 mg/dL (ref 65–99)
POTASSIUM: 4.3 mmol/L (ref 3.5–5.2)
SODIUM: 142 mmol/L (ref 134–144)

## 2015-08-28 LAB — SEDIMENTATION RATE: SED RATE: 2 mm/h (ref 0–30)

## 2015-08-28 LAB — C-REACTIVE PROTEIN: CRP: 1.4 mg/L (ref 0.0–4.9)

## 2015-08-28 NOTE — Telephone Encounter (Signed)
-----   Message from Anson Fret, MD sent at 08/28/2015 11:09 AM EST ----- Labs normal thanks

## 2015-08-28 NOTE — Telephone Encounter (Signed)
LVM for pt to call about results. Gave [facility] phone number and hours. Ok to inform pt labs normal per Dr Lucia Gaskins.

## 2015-09-03 NOTE — Telephone Encounter (Signed)
Called and spoke to pt about normal labs per Dr Ahern. Pt verbalized understanding.  

## 2015-09-16 ENCOUNTER — Ambulatory Visit (INDEPENDENT_AMBULATORY_CARE_PROVIDER_SITE_OTHER): Payer: 59

## 2015-09-16 DIAGNOSIS — R51 Headache: Secondary | ICD-10-CM | POA: Diagnosis not present

## 2015-09-16 DIAGNOSIS — H93A9 Pulsatile tinnitus, unspecified ear: Secondary | ICD-10-CM

## 2015-09-16 DIAGNOSIS — R200 Anesthesia of skin: Secondary | ICD-10-CM

## 2015-09-16 DIAGNOSIS — H539 Unspecified visual disturbance: Secondary | ICD-10-CM | POA: Diagnosis not present

## 2015-09-16 DIAGNOSIS — R519 Headache, unspecified: Secondary | ICD-10-CM

## 2015-09-16 MED ORDER — GADOPENTETATE DIMEGLUMINE 469.01 MG/ML IV SOLN: 17.0000 mL | Freq: Once | INTRAVENOUS | Status: AC | PRN

## 2015-09-18 ENCOUNTER — Telehealth: Payer: Self-pay | Admitting: *Deleted

## 2015-09-18 NOTE — Telephone Encounter (Signed)
-----   Message from Anson FretAntonia B Ahern, MD sent at 09/17/2015  6:23 PM EST ----- MRI of the brain didn't show any masses or strokes or anything acute or very concerning. No etiology for his headaches. He does have chronic microvascular ischemic changes. The MRA of his head was normal. He is welcome to come back for an appoitment so I can show it all to him. thanks

## 2015-09-18 NOTE — Telephone Encounter (Signed)
LVM for pt to call about results. Gave [facility] phone number.  

## 2015-09-21 ENCOUNTER — Telehealth: Payer: Self-pay | Admitting: Neurology

## 2015-09-21 NOTE — Telephone Encounter (Signed)
Called and spoke to pt about MRI/MRA results per Dr Ahern notes. He declined apt at this time to come in and review images. Advised to call back if he changes his mind. He verbalized understanding.  

## 2015-09-21 NOTE — Telephone Encounter (Signed)
Patient called back in regard to his test results.  Please call [phone].  Thanks!

## 2015-09-21 NOTE — Telephone Encounter (Signed)
Pt called back to make appt to review results for March 21st at 11:30am. Just an FYI.

## 2015-09-21 NOTE — Telephone Encounter (Signed)
I don't believe that I have the images from his 2012 MRI. Where di her have it done? If he can get us a copy of the images on disk and drop it off then I will happily compare it thanks.

## 2015-09-21 NOTE — Telephone Encounter (Signed)
Patient called back and asked if the MRI results had been compared with the results of his 2012 MRI. Please call.

## 2015-09-21 NOTE — Telephone Encounter (Signed)
Called and spoke to pt about MRI/MRA results per Dr Lucia GaskinsAhern notes. He declined apt at this time to come in and review images. Advised to call back if he changes his mind. He verbalized understanding.

## 2015-09-21 NOTE — Telephone Encounter (Signed)
Dr Ahern- please advise, thank you 

## 2015-09-21 NOTE — Telephone Encounter (Signed)
Tried calling pt. LVM for him to call back. Please relay Dr Lucia GaskinsAhern message below. I do see he has apt next Tuesday to review images w/ Dr Lucia GaskinsAhern

## 2015-09-23 NOTE — Telephone Encounter (Signed)
Called and spoke to wife. Pt not home from work yet. She will let pt know that he can drop off CD from MRI in 2012 for Dr Lucia GaskinsAhern to compare the two.

## 2015-09-25 NOTE — Telephone Encounter (Signed)
Patient is calling He states he thought he had an MRI done in our office in 2012 and he needs to discuss. Thank you.

## 2015-09-25 NOTE — Telephone Encounter (Signed)
Please ask him to reschedule, I will not have the CDs in time and I need time to review them thanks. Reschedule him for 2 weeks. thanks

## 2015-09-25 NOTE — Telephone Encounter (Signed)
Dr Lucia GaskinsAhern- Spoke to [place] in medical records. Pt did have MRI[facility] 2012 at [facility] She requested CD's. They should here Monday. Do you want pt to keep appt for Tuesday or r/s?

## 2015-09-25 NOTE — Telephone Encounter (Signed)
Called pt and relayed message below. He verbalized understanding. I advised we are still looking for CD's. He states he was on mobile MRI truck in 2012. [facility] not go to [facility], which is listed on report. He wants to know if he should keep appt for Tuesday to review imaging. Advised Dr Lucia GaskinsAhern out of office and he should keep appt. I will call him Monday if we need to r/s. He verbalized understanding.

## 2015-09-25 NOTE — Telephone Encounter (Signed)
[facility]ent onto [employer] since [facility] was in [employer]. Found pt had MRI brain/cervical spine in 2012 at [facility]. Spoke to [place] in medical records. She is going to call and see if they have the CD's they can send or if we have a copy.

## 2015-09-28 NOTE — Telephone Encounter (Signed)
Called pt. R/s to 4/3 at 8am, check in 745 am.

## 2015-09-29 ENCOUNTER — Ambulatory Visit: Payer: 59 | Admitting: Neurology

## 2015-10-13 ENCOUNTER — Ambulatory Visit (INDEPENDENT_AMBULATORY_CARE_PROVIDER_SITE_OTHER): Payer: 59 | Admitting: Neurology

## 2015-10-13 ENCOUNTER — Encounter: Payer: Self-pay | Admitting: Neurology

## 2015-10-13 VITALS — BP 118/70 | HR 67 | Resp 16 | Ht 70.0 in | Wt 180.0 lb

## 2015-10-13 DIAGNOSIS — R51 Headache: Secondary | ICD-10-CM | POA: Diagnosis not present

## 2015-10-13 DIAGNOSIS — M5481 Occipital neuralgia: Secondary | ICD-10-CM | POA: Diagnosis not present

## 2015-10-13 DIAGNOSIS — G43009 Migraine without aura, not intractable, without status migrainosus: Secondary | ICD-10-CM | POA: Diagnosis not present

## 2015-10-13 DIAGNOSIS — R519 Headache, unspecified: Secondary | ICD-10-CM

## 2015-10-13 MED ORDER — AMITRIPTYLINE HCL 10 MG PO TABS: 30.0000 mg | ORAL_TABLET | Freq: Every day | ORAL | Status: DC

## 2015-10-13 NOTE — Patient Instructions (Addendum)
Remember to drink plenty of fluid, eat healthy meals and do not skip any meals. Try to eat protein with a every meal and eat a healthy snack such as fruit or nuts in between meals. Try to keep a regular sleep-wake schedule and try to exercise daily, particularly in the form of walking, 20-30 minutes a day, if you can.   As far as your medications are concerned, I would like to suggest: Start with amitriptyline at night 1 pill and increase to 3 pills as necessary.  Occipital neuralgia  I would like to see you back in 3 months, sooner if we need to. Please call us with any interim questions, concerns, problems, updates or refill requests.   Our phone number is [phone]. We also have an after hours call service for urgent matters and there is a physician on-call for urgent questions. For any emergencies you know to call 911 or go to the nearest emergency room

## 2015-10-13 NOTE — Progress Notes (Signed)
Nerve Block: Lidocaine 2% Lot: [id number] Expiration 05/2019 NDC: 45409-811-9163323-486-27  Depo-medrol 80mg /mL Lot: [id number] Expiration: 07/2016 NDC: 6213-0865-780009-3475-01  Marcaine 0.5% Lot: [id number] Expiration: 02/08/2017 NDC: 4696-2952-840409-1162-01

## 2015-10-13 NOTE — Progress Notes (Addendum)
[facility]   Provider: Dr Lucia Gaskins Referring Provider: Gildardo Cranker, MD Primary Care Physician: No primary care provider on file.  CC: Headache  Interval history: the right side of the head continues to hurt. During the day he feels numbness. He had a sharp pain on the right of the head. Paroxysmal. Happening daily. It shoots from the back of the neck up to the top of the ear. Numbness, tingling, sharp, more of a numb pressure. May be occipital neuralgia. He also has neck pain. No tenderness when sleeping on it. reviwed MRi of the brain and compared with cd from 2012, no significant changes. Discussed with patient and wife nad reviewed images with them and significance of white matter changes.   MRI brain 09/16/2015: This MRI of the brain with and without contrast shows the following: 1. Scattered T2/FLAIR hyperintense foci in the subcortical and deep white matter of both hemispheres most consistent with chronic microvascular ischemic change. The extent is more than expected for age. 2. There are no acute findings.  MRA head 09/16/2015: normal.  Addendum 10/27/2015: Received old records MRI report 12/01/2010 impression: Abnormal MRI brain with and without demonstrating few scattered foci of nonspecific periventricular and subcortical gliosis. MRI of the cervical spine shows mild degenerative changes at C5-C6 without frank disc herniation, cord compression, root foraminal stenosis noted. Spinal cord parenchyma shows no abnormal signal characteristics. MRI of the brain 2011 shows bilateral small nonspecific periventricular and subcortical white matter hyperintensities none of these involve the brainstem, cerebellum scoliosis. Nonspecific.   HPI: Zachary Lara is a 55 y.o. male here as a referral from Dr. Tenny Craw for headache.PMHx IBS, herpes, gerd, eosinophilia, dysphagia, Hx of pericarditis. In 2013 had open heart surgery and was taking supplements and started taking  supplements that increased bolod pressure. He has stopped. About 6 months ago noticed numbness above the right ear like he slept on a rock. No inciting events, no trauma. Never had headaches in the past. Is continuous. Stopped taking the supplements. He describes it as pressure. No tingling or burning or radiation but it has become more diffuse. No ear pain. It is continuous. He feels more pressure during exercise. No light sensitivity, no sound sensitivity, no vomiting. He has vision changes some stars in his vision and times in the day when his vision is worse for unknown reason. No nausea or vomiting. No fevers, neck pain, or other systemic signs. No meningismus. He also describes a heart beating/ringing in his ears.    Review of Systems: Patient complains of symptoms per HPI as well as the following symptoms: No cp, no sob, no fever . Pertinent negatives per HPI. All others negative   Social History   Social History  . Marital Status: Married    Spouse Name: [name]  . Number of Children: N/A  . Years of Education: 16   Occupational History  . [employer]    Social History Main Topics  . Smoking status: Never Smoker   . Smokeless tobacco: Never Used  . Alcohol Use: No  . Drug Use: No  . Sexual Activity:    Partners: Female   Other Topics Concern  . Not on file   Social History Narrative   Lives with wife   Caffeine use: 2 cups coffee every morning       Family History  Problem Relation Age of Onset  . Heart attack Mother     multiple  . Stroke Neg Hx  Past Medical History  Diagnosis Date  . Granulomatous hepatitis   . Pericarditis     H/O, chronic calcific,normal stress cardiolite  10/20/2003 EF 52%  . Ventricular fibrillation (HCC)     POST OP PARICARDECTOMY - NO EPISODES SINCE  . Pulmonary edema     CURRENTLY HAS ABNORMAL CXR - [facility]  . Shortness of breath     WITH EXERTION -   . Dizziness     SINCE TAKING FLUID PILL  .  Arthritis   . H/O hiatal hernia   . Ulcer     NON-BLEEDING STOMACH ULCER  . Hemorrhoids     Past Surgical History  Procedure Laterality Date  . Pericardial stripping  08/19/11    [facility]  . Excisional hemorrhoidectomy    . Band hemorrhoidectomy    . Tonsillectomy    . Liver biopsy    . Lung surgery  2013    Current Outpatient Prescriptions  Medication Sig Dispense Refill  . acyclovir (ZOVIRAX) 400 MG tablet Take 400 mg by mouth.    . fish oil-omega-3 fatty acids 1000 MG capsule Take 2 g by mouth daily.    . Multiple Vitamin (MULITIVITAMIN WITH MINERALS) TABS Take 1 tablet by mouth daily with breakfast.     No current facility-administered medications for this visit.   Facility-Administered Medications Ordered in Other Visits  Medication Dose Route Frequency Provider Last Rate Last Dose  . gadopentetate dimeglumine (MAGNEVIST) injection 17 mL  17 mL Intravenous Once PRN Anson FretAntonia B Lucia Mccreadie, MD        Allergies as of 10/13/2015 - Review Complete 10/13/2015  Allergen Reaction Noted  . Bio-flax Hives 10/14/2011  . Cottonseed oil Hives 10/14/2011  . Other  08/27/2015    Vitals: BP 118/70 mmHg  Pulse 67  Resp 16  Ht 5\' 10"  (1.778 m)  Wt 180 lb (81.647 kg)  BMI 25.83 kg/m2 Last Weight:  Wt Readings from Last 1 Encounters:  10/13/15 180 lb (81.647 kg)   Last Height:   Ht Readings from Last 1 Encounters:  10/13/15 5\' 10"  (1.778 m)    Exam: Gen: NAD, conversant  CV: RRR, no MRG. No Carotid Bruits. No peripheral edema, warm, nontender Eyes: Conjunctivae clear without exudates or hemorrhage  Neuro: Detailed Neurologic Exam  Speech:  Speech is normal; fluent and spontaneous with normal comprehension.  Cognition:  The patient is oriented to person, place, and time;   recent and remote memory intact;   language fluent;   normal attention, concentration,   fund of knowledge Cranial Nerves:  The pupils are equal, round, and  reactive to light. The fundi are normal and spontaneous venous pulsations are present. Visual fields are full to finger confrontation. Extraocular movements are intact. Trigeminal sensation is intact and the muscles of mastication are normal. The face is symmetric. The palate elevates in the midline. Hearing intact. Voice is normal. Shoulder shrug is normal. The tongue has normal motion without fasciculations.   Coordination:  Normal finger to nose and heel to shin. Normal rapid alternating movements.   Gait:  Heel-toe and tandem gait are normal.   Motor Observation:  No asymmetry, no atrophy, and no involuntary movements noted. Tone:  Normal muscle tone.   Posture:  Posture is normal. normal erect   Strength:  Strength is V/V in the upper and lower limbs.    Sensation: intact to LT   Reflex Exam:  DTR's:  Deep tendon reflexes in the upper and lower extremities  are brisk bilaterally.  Toes:  The toes are downgoing bilaterally.  Clonus:  3 beats left AJ. .   Assessment/Plan: 55 year old male with new onset right occipital headache continuous for 6 months, vision changes, pulsatile tinnitus without h/o headaches. Exam essentially normal except for very brisk reflexes and 3 beats on left AJ. MRI of the brain unremarkable and no significant changes from 2012 (patient brought disk). MRA without aneurysm. Will perform occipital nerve block and see if helps with the headache that radiates from the occipital right scalp in the area of the lesser and auricular nerves.  Pain is right-sided and is located in the distribution of the lesser and/or third and occipital occipital nerves, paroxysmal and brief, painful, sharp, with tenderness and trigger points at the emergence of the occipital nerve in the suboccipital area. Differential includes pain in the upper cervical joints or disks, suboccipital or upper posterior neck muscles including the traps/scm,,  compressive disk disease and others. May need MRI of the cervical spine.     NERVE BLOCK PROCEDURE NOTE  Procedure: Patient was consented for right occipital nerve blocks. A solution containing 0.5% /ml Bupivacaine 1-cc 2% lidocaine 1cc and  Depo Medrol 1cc was prepared in  3-CC syringes with 27 gauge 1/2 inch needle.   6 Target areas in the occipital and suboccipital and cervical paraspinal regions were identified via palpation and pain response.The sites junctions were sterilized with alcohol wipes.The contents of each syringe was injected in a fanlike fashion. The headache improved from 7/10 to 0/10. Patient tolerated the procedure well and no complications were noted.    Consent was provided below and patient acknowledged understanding:   Nerve Block:  Lidocaine 2%  Lot: [id number]  Expiration 05/2019  NDC: 45409-811-91   Depo-medrol /mL  Lot: [id number]  Expiration: 07/2016  NDC: 6213-0865-78   Marcaine 0.5%  Lot: [id number]  Expiration: 02/08/2017  NDC: 4696-2952-84    What to expect afterwards?  Immediately after the injection, the back of your head may feel warm and numb. You may also experience reduction in the pain. The local anaesthetic wears off in a few hours and the steroid usually takes  3-7 days to take effect.   The pain relief is vary variable and can last from a few days to several months. Some patients do not experience any pain relief. Hence it is difficult to predict the outcome of the injection treatment in a particular patient.   There may be some discomfort at the injection site for a couple of days after treatment, however, this should settle quite quickly. We advise you to take things easy for the rest of the day. Continue taking your pain medication as advised by your consultant or until you feel benefit from the treatment.   What are the side effects / complications?  Common   Soreness / bruising at the injection site.   Temporary increase  (up to 7 days) in pain following procedure.   Rare   Bleeding   Infection at the injection site   Allergic reaction   New pain   Worsening pain      CC: Dr. Tenny Craw.   Naomie Dean, MD  [facility] [address] [place], [place] [address]  Phone [phone] Fax [phone]  A total of 30  minutes was spent face-to-face with this patient. Over half this time was spent on counseling patient on the occipital headache vs neuralgia diagnosis and different diagnostic and therapeutic options available.

## 2015-10-18 DIAGNOSIS — R51 Headache: Secondary | ICD-10-CM

## 2015-10-18 DIAGNOSIS — R519 Headache, unspecified: Secondary | ICD-10-CM | POA: Insufficient documentation

## 2016-01-13 ENCOUNTER — Ambulatory Visit (INDEPENDENT_AMBULATORY_CARE_PROVIDER_SITE_OTHER): Payer: 59 | Admitting: Neurology

## 2016-01-13 ENCOUNTER — Encounter: Payer: Self-pay | Admitting: Neurology

## 2016-01-13 VITALS — BP 111/74 | HR 72 | Ht 70.0 in | Wt 172.8 lb

## 2016-01-13 DIAGNOSIS — M5481 Occipital neuralgia: Secondary | ICD-10-CM

## 2016-01-13 DIAGNOSIS — R51 Headache: Secondary | ICD-10-CM

## 2016-01-13 DIAGNOSIS — G8929 Other chronic pain: Secondary | ICD-10-CM

## 2016-01-13 DIAGNOSIS — M5382 Other specified dorsopathies, cervical region: Secondary | ICD-10-CM

## 2016-01-13 DIAGNOSIS — M542 Cervicalgia: Secondary | ICD-10-CM | POA: Diagnosis not present

## 2016-01-13 DIAGNOSIS — R519 Headache, unspecified: Secondary | ICD-10-CM

## 2016-01-13 MED ORDER — CYCLOBENZAPRINE HCL 10 MG PO TABS: 10.0000 mg | ORAL_TABLET | Freq: Three times a day (TID) | ORAL | Status: DC | PRN

## 2016-01-13 MED ORDER — NORTRIPTYLINE HCL 10 MG PO CAPS: 20.0000 mg | ORAL_CAPSULE | Freq: Every day | ORAL | Status: DC

## 2016-01-13 NOTE — Progress Notes (Signed)
Nerve Block: Lidocaine 2%- 2.5 mL total Lot: [id number] Expiration 05/2019 NDC: 45409-811-9163323-486-27  Depo-medrol 80mg /mL- 1 mL total Lot: [id number] Expiration: 12/2016 NDC: 6213-0865-780009-3475-01  Marcaine 0.5%- 2.5 mL total Lot: [id number] Expiration: 05/11/2017 NDC: [phone]

## 2016-01-13 NOTE — Patient Instructions (Addendum)
Remember to drink plenty of fluid, eat healthy meals and do not skip any meals. Try to eat protein with a every meal and eat a healthy snack such as fruit or nuts in between meals. Try to keep a regular sleep-wake schedule and try to exercise daily, particularly in the form of walking, 20-30 minutes a day, if you can.   As far as your medications are concerned, I would like to suggest; try nortriptyline  at night can increase to  in 1-2 weeks. We can increase this even more as needed. Flexeril as needed for neck tightness.   I would like to see you back as needed, sooner if we need to. Please call us with any interim questions, concerns, problems, updates or refill requests.   Our phone number is [phone]. We also have an after hours call service for urgent matters and there is a physician on-call for urgent questions. For any emergencies you know to call 911 or go to the nearest emergency room  Nortriptyline capsules What is this medicine? NORTRIPTYLINE (nor TRIP ti leen) is used to treat depression. This medicine may be used for other purposes; ask your health care provider or pharmacist if you have questions. What should I tell my health care provider before I take this medicine? They need to know if you have any of these conditions: -an alcohol problem -bipolar disorder or schizophrenia -difficulty passing urine, prostate trouble -glaucoma -heart disease or recent heart attack -liver disease -over active thyroid -seizures -thoughts or plans of suicide or a previous suicide attempt or family history of suicide attempt -an unusual or allergic reaction to nortriptyline, other medicines, foods, dyes, or preservatives -pregnant or trying to get pregnant -breast-feeding How should I use this medicine? Take this medicine by mouth with a glass of water. Follow the directions on the prescription label. Take your doses at regular intervals. Do not take it more often than directed. Do  not stop taking this medicine suddenly except upon the advice of your doctor. Stopping this medicine too quickly may cause serious side effects or your condition may worsen. A special MedGuide will be given to you by the pharmacist with each prescription and refill. Be sure to read this information carefully each time. Talk to your pediatrician regarding the use of this medicine in children. Special care may be needed. Overdosage: If you think you have taken too much of this medicine contact a poison control center or emergency room at once. NOTE: This medicine is only for you. Do not share this medicine with others. What if I miss a dose? If you miss a dose, take it as soon as you can. If it is almost time for your next dose, take only that dose. Do not take double or extra doses. What may interact with this medicine? Do not take this medicine with any of the following medications: -arsenic trioxide -certain medicines medicines for irregular heart beat -cisapride -halofantrine -linezolid -MAOIs like Carbex, Eldepryl, Marplan, Nardil, and Parnate -methylene blue (injected into a vein) -other medicines for mental depression -phenothiazines like perphenazine, thioridazine and chlorpromazine -pimozide -probucol -procarbazine -sparfloxacin -St. John's Wort -ziprasidone This medicine may also interact with any of the following medications: -atropine and related drugs like hyoscyamine, scopolamine, tolterodine and others -barbiturate medicines for inducing sleep or treating seizures, such as phenobarbital -cimetidine -medicines for diabetes -medicines for seizures like carbamazepine or phenytoin -reserpine -thyroid medicine This list may not describe all possible interactions. Give your health care provider a list of  all the medicines, herbs, non-prescription drugs, or dietary supplements you use. Also tell them if you smoke, drink alcohol, or use illegal drugs. Some items may interact with  your medicine. What should I watch for while using this medicine? Tell your doctor if your symptoms do not get better or if they get worse. Visit your doctor or health care professional for regular checks on your progress. Because it may take several weeks to see the full effects of this medicine, it is important to continue your treatment as prescribed by your doctor. Patients and their families should watch out for new or worsening thoughts of suicide or depression. Also watch out for sudden changes in feelings such as feeling anxious, agitated, panicky, irritable, hostile, aggressive, impulsive, severely restless, overly excited and hyperactive, or not being able to sleep. If this happens, especially at the beginning of treatment or after a change in dose, call your health care professional. Bonita QuinYou may get drowsy or dizzy. Do not drive, use machinery, or do anything that needs mental alertness until you know how this medicine affects you. Do not stand or sit up quickly, especially if you are an older patient. This reduces the risk of dizzy or fainting spells. Alcohol may interfere with the effect of this medicine. Avoid alcoholic drinks. Do not treat yourself for coughs, colds, or allergies without asking your doctor or health care professional for advice. Some ingredients can increase possible side effects. Your mouth may get dry. Chewing sugarless gum or sucking hard candy, and drinking plenty of water may help. Contact your doctor if the problem does not go away or is severe. This medicine may cause dry eyes and blurred vision. If you wear contact lenses you may feel some discomfort. Lubricating drops may help. See your eye doctor if the problem does not go away or is severe. This medicine can cause constipation. Try to have a bowel movement at least every 2 to 3 days. If you do not have a bowel movement for 3 days, call your doctor or health care professional. This medicine can make you more sensitive  to the sun. Keep out of the sun. If you cannot avoid being in the sun, wear protective clothing and use sunscreen. Do not use sun lamps or tanning beds/booths. What side effects may I notice from receiving this medicine? Side effects that you should report to your doctor or health care professional as soon as possible: -allergic reactions like skin rash, itching or hives, swelling of the face, lips, or tongue -abnormal production of milk in females -breast enlargement in both males and females -breathing problems -confusion, hallucinations -fever with increased sweating -irregular or fast, pounding heartbeat -muscle stiffness, or spasms -pain or difficulty passing urine, loss of bladder control -seizures -suicidal thoughts or other mood changes -swelling of the testicles -tingling, pain, or numbness in the feet or hands -yellowing of the eyes or skin Side effects that usually do not require medical attention (report to your doctor or health care professional if they continue or are bothersome): -change in sex drive or performance -diarrhea -nausea, vomiting -weight gain or loss This list may not describe all possible side effects. Call your doctor for medical advice about side effects. You may report side effects to FDA at 1-800-FDA-1088. Where should I keep my medicine? Keep out of the reach of children. Store at room temperature between 15 and 30 degrees C (59 and 86 degrees F). Keep container tightly closed. Throw away any unused medicine after the expiration  date. NOTE: This sheet is a summary. It may not cover all possible information. If you have questions about this medicine, talk to your doctor, pharmacist, or health care provider.    2016, [employer]. (2011-11-14 13:57:12)  Cyclobenzaprine tablets What is this medicine? CYCLOBENZAPRINE (sye kloe BEN za preen) is a muscle relaxer. It is used to treat muscle pain, spasms, and stiffness. This medicine may be used  for other purposes; ask your health care provider or pharmacist if you have questions. What should I tell my health care provider before I take this medicine? They need to know if you have any of these conditions: -heart disease, irregular heartbeat, or previous heart attack -liver disease -thyroid problem -an unusual or allergic reaction to cyclobenzaprine, tricyclic antidepressants, lactose, other medicines, foods, dyes, or preservatives -pregnant or trying to get pregnant -breast-feeding How should I use this medicine? Take this medicine by mouth with a glass of water. Follow the directions on the prescription label. If this medicine upsets your stomach, take it with food or milk. Take your medicine at regular intervals. Do not take it more often than directed. Talk to your pediatrician regarding the use of this medicine in children. Special care may be needed. Overdosage: If you think you have taken too much of this medicine contact a poison control center or emergency room at once. NOTE: This medicine is only for you. Do not share this medicine with others. What if I miss a dose? If you miss a dose, take it as soon as you can. If it is almost time for your next dose, take only that dose. Do not take double or extra doses. What may interact with this medicine? Do not take this medicine with any of the following medications: -certain medicines for fungal infections like fluconazole, itraconazole, ketoconazole, posaconazole, voriconazole -cisapride -dofetilide -dronedarone -droperidol -flecainide -grepafloxacin -halofantrine -levomethadyl -MAOIs like Carbex, Eldepryl, Marplan, Nardil, and Parnate -nilotinib -pimozide -probucol -sertindole -thioridazine -ziprasidone This medicine may also interact with the following medications: -abarelix -alcohol -certain medicines for cancer -certain medicines for depression, anxiety, or psychotic disturbances -certain medicines for  infection like alfuzosin, chloroquine, clarithromycin, levofloxacin, mefloquine, pentamidine, troleandomycin -certain medicines for an irregular heart beat -certain medicines used for sleep or numbness during surgery or procedure -contrast dyes -dolasetron -guanethidine -methadone -octreotide -ondansetron -other medicines that prolong the QT interval (cause an abnormal heart rhythm) -palonosetron -phenothiazines like chlorpromazine, mesoridazine, prochlorperazine, thioridazine -tramadol -vardenafil This list may not describe all possible interactions. Give your health care provider a list of all the medicines, herbs, non-prescription drugs, or dietary supplements you use. Also tell them if you smoke, drink alcohol, or use illegal drugs. Some items may interact with your medicine. What should I watch for while using this medicine? Check with your doctor or health care professional if your condition does not improve within 1 to 3 weeks. You may get drowsy or dizzy when you first start taking the medicine or change doses. Do not drive, use machinery, or do anything that may be dangerous until you know how the medicine affects you. Stand or sit up slowly. Your mouth may get dry. Drinking water, chewing sugarless gum, or sucking on hard candy may help. What side effects may I notice from receiving this medicine? Side effects that you should report to your doctor or health care professional as soon as possible: -allergic reactions like skin rash, itching or hives, swelling of the face, lips, or tongue -chest pain -fast heartbeat -hallucinations -seizures -vomiting Side effects that  usually do not require medical attention (report to your doctor or health care professional if they continue or are bothersome): -headache This list may not describe all possible side effects. Call your doctor for medical advice about side effects. You may report side effects to FDA at 1-800-FDA-1088. Where should  I keep my medicine? Keep out of the reach of children. Store at room temperature between 15 and 30 degrees C (59 and 86 degrees F). Keep container tightly closed. Throw away any unused medicine after the expiration date. NOTE: This sheet is a summary. It may not cover all possible information. If you have questions about this medicine, talk to your doctor, pharmacist, or health care provider.    2016, [employer]. (2013-01-22 12:48:19)

## 2016-01-13 NOTE — Progress Notes (Addendum)
[facility]    Provider:  Dr Lucia GaskinsAhern Referring Provider: Gildardo Crankerharles Ross Primary Care Physician:  Gildardo Crankerharles Ross  CC: Headache  Interval history 01/13/2016: He stopped the Amitriptyline because it made him too groggy. Tension in the neck makes the headaches worse and the two are tied together and he is keeping his head and neck more stable and keeping neck in position. Associated with working out. He exercises every other day. He can still feel neuralgia on the side of his head. He has tight trapezius. The headache is every day but not as intense, worse after working out. We will try nortriptyline. Amitriptyline helped but made him too sedated. Discussed occipital neuralgia and further steps. Will refer to Dr. Marvia Picklesemus for C2-C3 medial branch blocks for occipital neuralgia.   Interval history: the right side of the head continues to hurt. During the day he feels numbness. He had a sharp pain on the right of the head. Paroxysmal. Happening daily. It shoots from the back of the neck up to the top of the ear. Numbness, tingling, sharp, more of a numb pressure. May be occipital neuralgia. He also has neck pain. No tenderness when sleeping on it. reviwed MRi of the brain and compared with cd from 2012, no significant changes. Discussed with patient and wife nad reviewed images with them and significance of white matter changes.   MRI brain 09/16/2015: This MRI of the brain with and without contrast shows the following: 1. Scattered T2/FLAIR hyperintense foci in the subcortical and deep white matter of both hemispheres most consistent with chronic microvascular ischemic change. The extent is more than expected for age. 2. There are no acute findings.  MRA head 09/16/2015: normal.  Addendum 10/27/2015: Received old records MRI report 12/01/2010 impression: Abnormal MRI brain with and without demonstrating few scattered foci of nonspecific periventricular and subcortical gliosis. MRI of the  cervical spine shows mild degenerative changes at C5-C6 without frank disc herniation, cord compression, root foraminal stenosis noted. Spinal cord parenchyma shows no abnormal signal characteristics. MRI of the brain 2011 shows bilateral small nonspecific periventricular and subcortical white matter hyperintensities none of these involve the brainstem, cerebellum scoliosis. Nonspecific.  HPI: Zachary RousselJames A Lara is a 55 y.o. male here as a referral from Dr. Tenny Crawoss for headache.PMHx IBS, herpes, gerd, eosinophilia, dysphagia, Hx of pericarditis. In 2013 had open heart surgery and was taking supplements and started taking supplements that increased bolod pressure. He has stopped. About 6 months ago noticed numbness above the right ear like he slept on a rock. No inciting events, no trauma. Never had headaches in the past. Is continuous. Stopped taking the supplements. He describes it as pressure. No tingling or burning or radiation but it has become more diffuse. No ear pain. It is continuous. He feels more pressure during exercise. No light sensitivity, no sound sensitivity, no vomiting. He has vision changes some stars in his vision and times in the day when his vision is worse for unknown reason. No nausea or vomiting. No fevers, neck pain, or other systemic signs. No meningismus. He also describes a heart beating/ringing in his ears.    Review of Systems: Patient complains of symptoms per HPI as well as the following symptoms: No cp, no sob, no fever . Pertinent negatives per HPI. All others negative.   Social History   Social History  . Marital Status: Married    Spouse Name: [place]  . Number of Children: N/A  . Years of Education: 7616  Occupational History  . [employer]    Social History Main Topics  . Smoking status: Never Smoker   . Smokeless tobacco: Never Used  . Alcohol Use: No  . Drug Use: No  . Sexual Activity:    Partners: Female   Other Topics Concern  . Not on file     Social History Narrative   Lives with wife   Caffeine use: 2 cups coffee every morning       Family History  Problem Relation Age of Onset  . Heart attack Mother     multiple  . Stroke Neg Hx     Past Medical History  Diagnosis Date  . Granulomatous hepatitis   . Pericarditis     H/O, chronic calcific,normal stress cardiolite  10/20/2003 EF 52%  . Ventricular fibrillation (HCC)     POST OP PARICARDECTOMY - NO EPISODES SINCE  . Pulmonary edema     CURRENTLY HAS ABNORMAL CXR - [facility]  . Shortness of breath     WITH EXERTION -   . Dizziness     SINCE TAKING FLUID PILL  . Arthritis   . H/O hiatal hernia   . Ulcer     NON-BLEEDING STOMACH ULCER  . Hemorrhoids     Past Surgical History  Procedure Laterality Date  . Pericardial stripping  08/19/11    [facility]  . Excisional hemorrhoidectomy    . Band hemorrhoidectomy    . Tonsillectomy    . Liver biopsy    . Lung surgery  2013    Current Outpatient Prescriptions  Medication Sig Dispense Refill  . acyclovir (ZOVIRAX) 400 MG tablet Take 400 mg by mouth.    . fish oil-omega-3 fatty acids 1000 MG capsule Take 2 g by mouth daily.    . Multiple Vitamin (MULITIVITAMIN WITH MINERALS) TABS Take 1 tablet by mouth daily with breakfast.    . omeprazole (PRILOSEC) 40 MG capsule Take 40 mg by mouth 2 (two) times daily.  2   No current facility-administered medications for this visit.   Facility-Administered Medications Ordered in Other Visits  Medication Dose Route Frequency Provider Last Rate Last Dose  . gadopentetate dimeglumine (MAGNEVIST) injection 17 mL  17 mL Intravenous Once PRN Anson FretAntonia B Ahern, MD        Allergies as of 01/13/2016 - Review Complete 01/13/2016  Allergen Reaction Noted  . Bio-flax Hives 10/14/2011  . Cottonseed oil Hives 10/14/2011  . Other  08/27/2015    Vitals: BP 111/74 mmHg  Pulse 72  Ht 5\' 10"  (1.778 m)  Wt 172 lb 12.8 oz (78.382 kg)  BMI 24.79 kg/m2 Last  Weight:  Wt Readings from Last 1 Encounters:  01/13/16 172 lb 12.8 oz (78.382 kg)   Last Height:   Ht Readings from Last 1 Encounters:  01/13/16 5\' 10"  (1.778 m)     Neuro: Detailed Neurologic Exam  Speech:  Speech is normal; fluent and spontaneous with normal comprehension.  Cognition:  The patient is oriented to person, place, and time;   recent and remote memory intact;   language fluent;   normal attention, concentration,   fund of knowledge Cranial Nerves:  The pupils are equal, round, and reactive to light. The fundi are normal and spontaneous venous pulsations are present. Visual fields are full to finger confrontation. Extraocular movements are intact. Trigeminal sensation is intact and the muscles of mastication are normal. The face is symmetric. The palate elevates in the midline. Hearing intact.  Voice is normal. Shoulder shrug is normal. The tongue has normal motion without fasciculations.   Coordination:  Normal finger to nose and heel to shin. Normal rapid alternating movements.   Gait:  Heel-toe and tandem gait are normal.   Motor Observation:  No asymmetry, no atrophy, and no involuntary movements noted. Tone:  Normal muscle tone.   Posture:  Posture is normal. normal erect   Strength:  Strength is V/V in the upper and lower limbs.    Sensation: intact to LT   Reflex Exam:  DTR's:  Deep tendon reflexes in the upper and lower extremities are brisk bilaterally.  Toes:  The toes are downgoing bilaterally.  Clonus:  3 beats left AJ. .   Assessment/Plan: 55 year old male with61 new onset right occipital headache continuous for 6 months, vision changes, pulsatile tinnitus without h/o headaches. Exam essentially normal except for very brisk reflexes and 3 beats on left AJ. MRI of the brain unremarkable and no significant changes from 2012 (patient brought disk). MRA without aneurysm. Will perform  occipital nerve block and see if helps with the headache that radiates from the occipital right scalp in the area of the lesser and auricular nerves.  Pain is right-sided and is located in the distribution of the lesser and/or third and occipital occipital nerves, paroxysmal and brief, painful, sharp, with tenderness and trigger points at the emergence of the occipital nerve in the suboccipital area. Differential includes pain in the upper cervical joints or disks, suboccipital or upper posterior neck muscles including the traps/scm,, compressive disk disease and others. May need MRI of the cervical spine.   Start Nortriptyline. Start Flexeril. Discussed side effects as documented in the patient instructions. Nerve block today  Discussed occipital neuralgia and further steps. Will refer to Dr. Ethelene Hal for C2-C3 medial branch blocks for occipital neuralgia or any other procedure as he sees clinically warranted.   NERVE BLOCK PROCEDURE NOTE  Procedure:  Performed by Dr. Lucia Gaskins M.D. Ingredients below were placed in a 30-gauge needlea. All procedures a documented blood were medically necessary, reasonable and appropriate based on the patient's history, medical diagnosis and physician opinion. Verbal informed consent was obtained from the patient, patient was informed of potential risk of procedure, including bruising, bleeding, hematoma formation, infection, muscle weakness, muscle pain, numbness, transient hypertension, transient hyperglycemia and transient insomnia among others. All areas injected were topically clean with isopropyl rubbing alcohol. Nonsterile nonlatex gloves were worn during the procedure.   1. Trigger point injections (16109). The greater occipital nerve site was identified at the nuchal line medial to the occipital artery. Medication was injected into the right occipital nerve areas and suboccipital and cervical paraspinal areas. Patient's condition is associated with inflammation of  the greater occipital nerve and associated multiple cervical muscle groups. Injection was deemed medically necessary, reasonable and appropriate. Injection represents a separate and unique surgical service.  2. Lesser occipital nerve block [phone]). The lesser occipital nerve site was identified approximately 2 cm lateral to the greater occipital nerve. Occasion was injected into the right occipital nerve areas. Patient's condition is associated with inflammation of the lesser occipital nerve and associated muscle groups. Injection was deemed medically necessary, reasonable and appropriate. Injection represents a separate and unique surgical service.  3. Auriculotemporal nerve block (09811): The Auriculotemporal nerve site was identified along the posterior margin of the sternocleidomastoid muscle toward the base of the ear. Medication was injected into the right radicular temporal nerve areas. Patient's condition is associated with inflammation of  the Auriculotemporal Nerve and associated muscle groups. Injection was deemed medically necessary, reasonable and appropriate. Injection represents a separate and unique surgical service.  Consent was provided below and patient acknowledged understanding:   Nerve Block: 2.30ml Lidocaine 2%  Lot: [id number]  Expiration 05/2019  NDC: 45409-811-91   Depo-medrol 80mg /mL 1ml Lot: [id number]  Expiration: 07/2016  NDC: 6213-0865-78   Marcaine 0.5%  2.2ml Lot: [id number]  Expiration: 02/08/2017  NDC: 4696-2952-84    What to expect afterwards?  Immediately after the injection, the back of your head may feel warm and numb. You may also experience reduction in the pain. The local anaesthetic wears off in a few hours and the steroid usually takes  3-7 days to take effect.   The pain relief is vary variable and can last from a few days to several months. Some patients do not experience any pain relief. Hence it is difficult to predict the outcome of the  injection treatment in a particular patient.   There may be some discomfort at the injection site for a couple of days after treatment, however, this should settle quite quickly. We advise you to take things easy for the rest of the day. Continue taking your pain medication as advised by your consultant or until you feel benefit from the treatment.   What are the side effects / complications?  Common   Soreness / bruising at the injection site.   Temporary increase (up to 7 days) in pain following procedure.   Rare   Bleeding   Infection at the injection site   Allergic reaction   New pain   Worsening pain      CC: Dr. Tenny Craw.   Naomie Dean, MD  [facility] [address] [place], [place] [address]  Phone [phone] Fax [phone]  A total of 30 minutes was spent face-to-face with this patient(not including the time needed for nerve block). Over half this time was spent on counseling patient on the occipital headache vs neuralgia and cervical musculoskeletal pain diagnosis and different diagnostic and therapeutic options available.

## 2016-03-04 ENCOUNTER — Other Ambulatory Visit: Payer: Self-pay | Admitting: Neurology

## 2016-03-04 DIAGNOSIS — M5481 Occipital neuralgia: Secondary | ICD-10-CM

## 2016-03-07 ENCOUNTER — Other Ambulatory Visit: Payer: Self-pay | Admitting: Family Medicine

## 2016-03-08 ENCOUNTER — Other Ambulatory Visit: Payer: Self-pay | Admitting: Family Medicine

## 2016-03-08 DIAGNOSIS — R1031 Right lower quadrant pain: Secondary | ICD-10-CM

## 2016-03-29 ENCOUNTER — Ambulatory Visit
Admission: RE | Admit: 2016-03-29 | Discharge: 2016-03-29 | Disposition: A | Discharge status: Home / Self Care | Payer: 59 | Source: Ambulatory Visit | Attending: Family Medicine | Admitting: Family Medicine

## 2016-03-29 DIAGNOSIS — R1031 Right lower quadrant pain: Secondary | ICD-10-CM

## 2016-03-29 MED ORDER — IOPAMIDOL (ISOVUE-300) INJECTION 61%
100.0000 mL | Freq: Once | INTRAVENOUS | Status: AC | PRN
  Administered 2016-03-29: 100 mL via INTRAVENOUS

## 2016-05-16 ENCOUNTER — Ambulatory Visit: Payer: 59 | Admitting: Neurology

## 2018-05-01 IMAGING — CT CT ABD-PELV W/ CM
3 of 5 series · 13 of 36 positions shown, 19 images · IV contrast (READICAT/WATER & [ID] ISOVUE 300)
Comparison: Abdominal ultrasound 04/14/2014

CLINICAL DATA: Right lower quadrant pain for over a year.

EXAM:
CT ABDOMEN AND PELVIS WITH CONTRAST
TECHNIQUE: Multidetector CT imaging of the abdomen and pelvis was performed
using the standard protocol following bolus administration of
intravenous contrast.
CONTRAST:  100mL [id number] IOPAMIDOL ([id number]) INJECTION 61%

[Series 3: abd/pelvis with · axial · 0.73mm/px · z∈[-358,-18]mm · 8 of 88 slices shown, 13 images]
[im 10/88  soft-tissue]
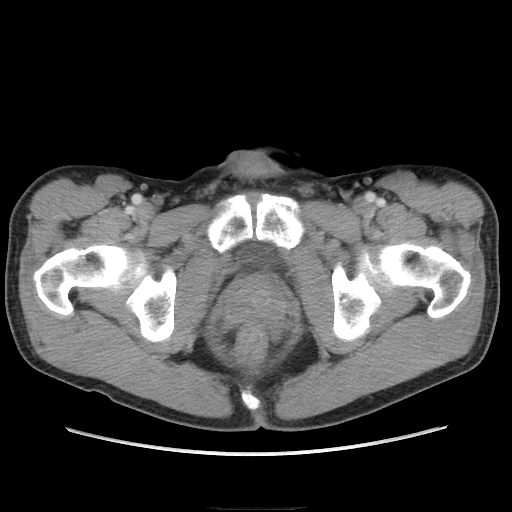
[im 10/88  bone]
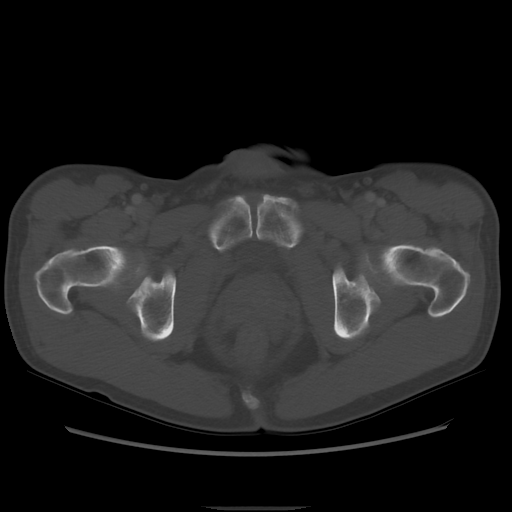
[im 20/88  soft-tissue]
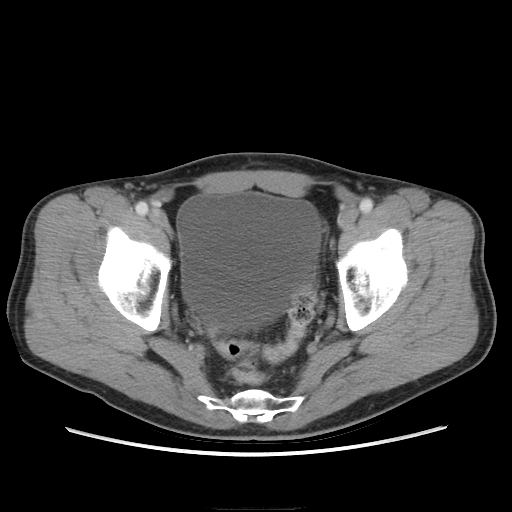
[im 30/88  soft-tissue]
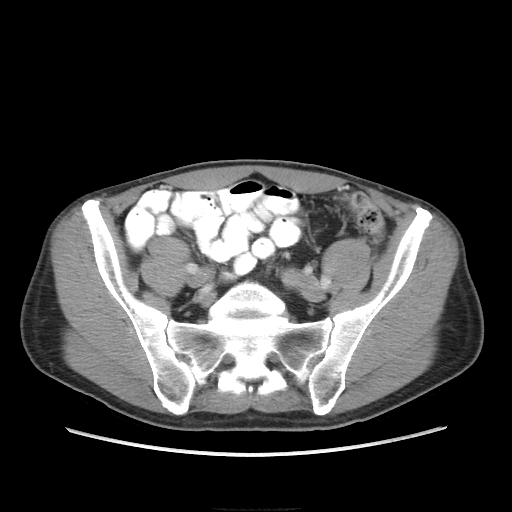
[im 39/88  soft-tissue]
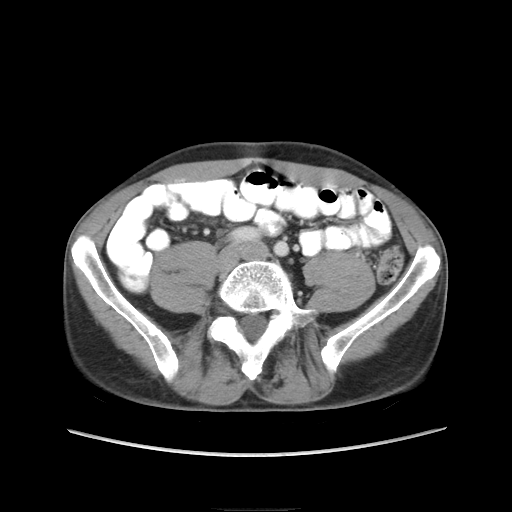
[im 49/88  soft-tissue]
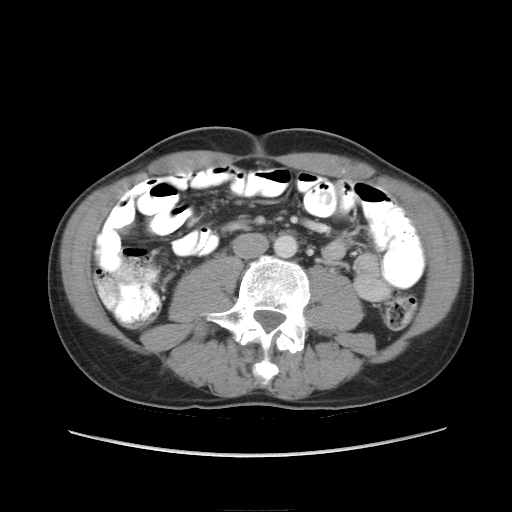
[im 49/88  lung]
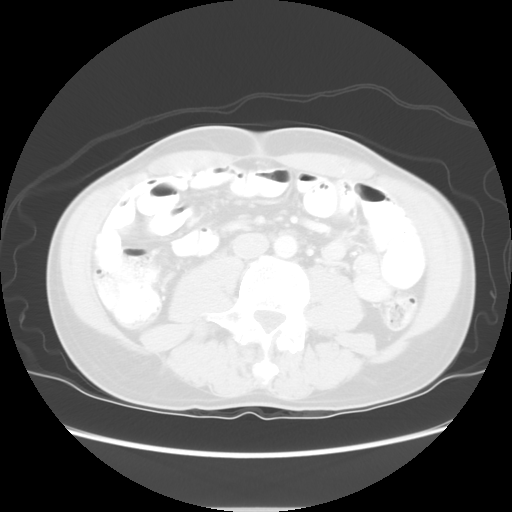
[im 59/88  soft-tissue]
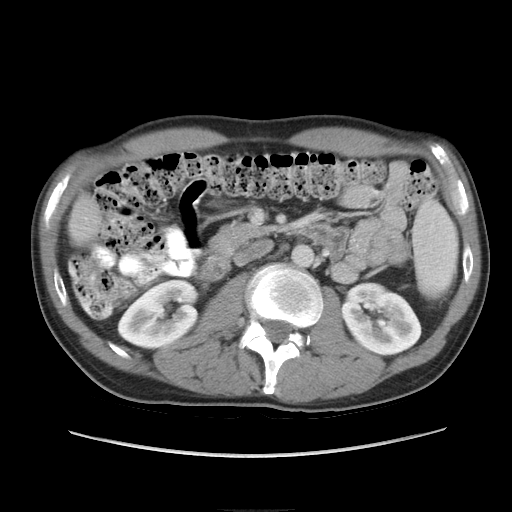
[im 59/88  lung]
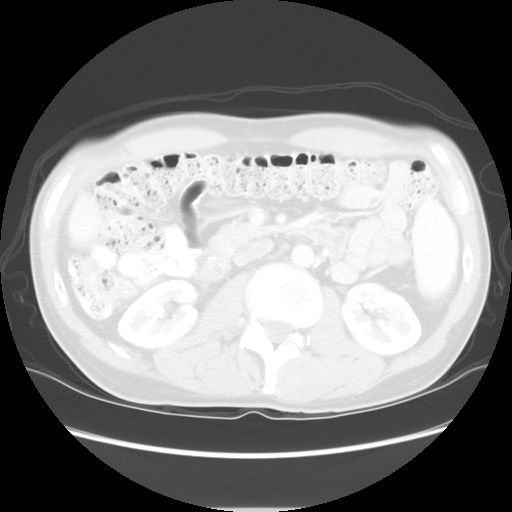
[im 68/88  soft-tissue]
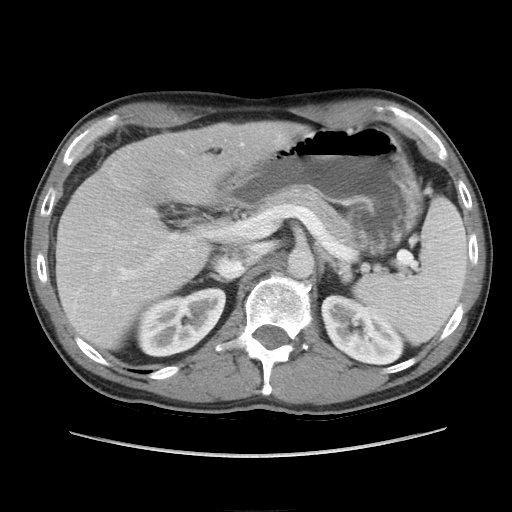
[im 68/88  lung]
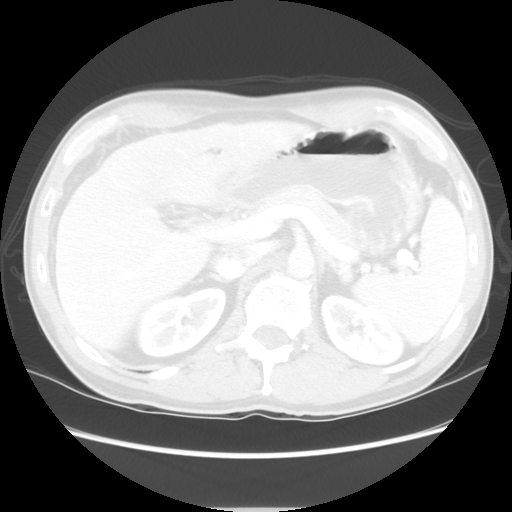
[im 78/88  soft-tissue]
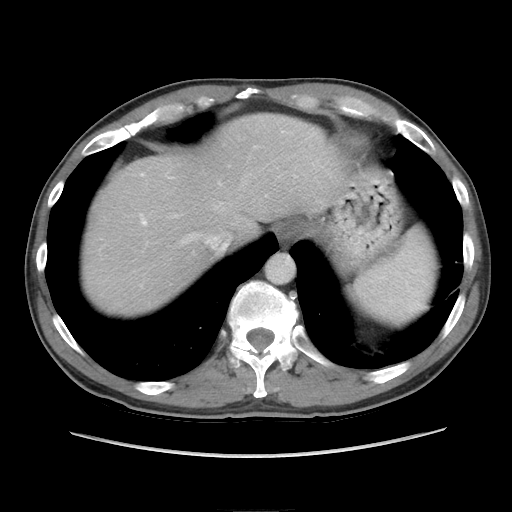
[im 78/88  lung]
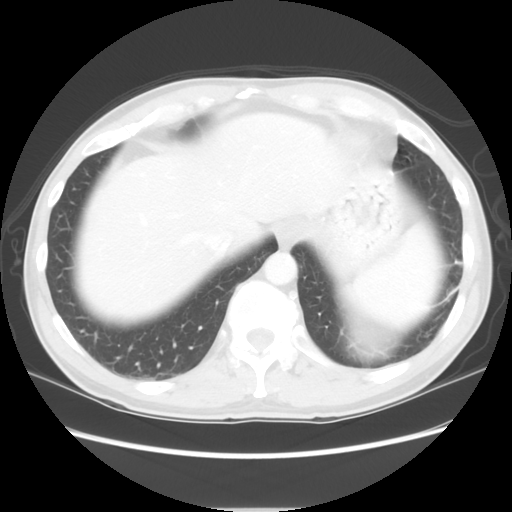

[Series 601: coronal body · coronal · 0.90mm/px · 1 of 109 slices shown, 2 images]
[im 37/109  soft-tissue]
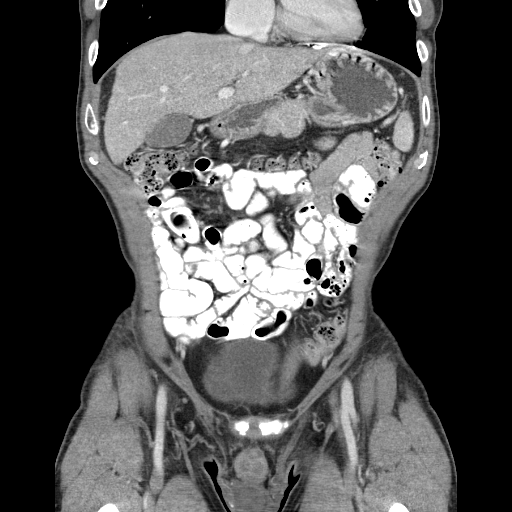
[im 37/109  bone]
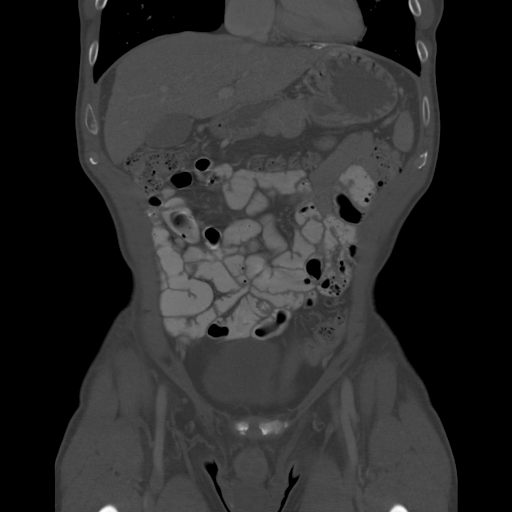

[Series 602: sagittal body · sagittal · 0.90mm/px · 4 of 150 slices shown]
[im 10/150  soft-tissue]
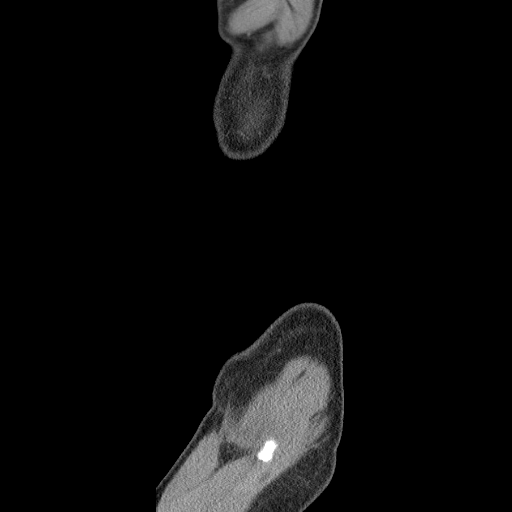
[im 30/150  soft-tissue]
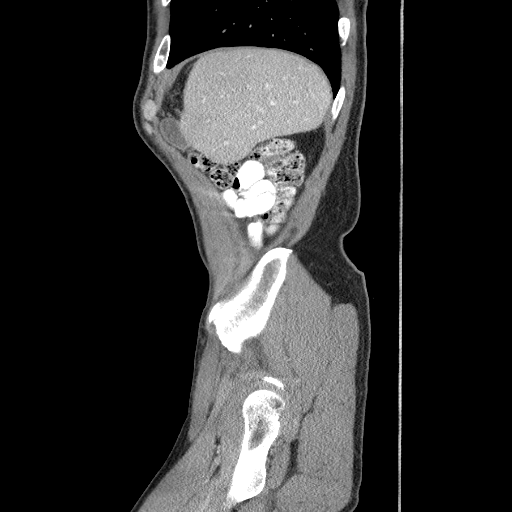
[im 50/150  soft-tissue]
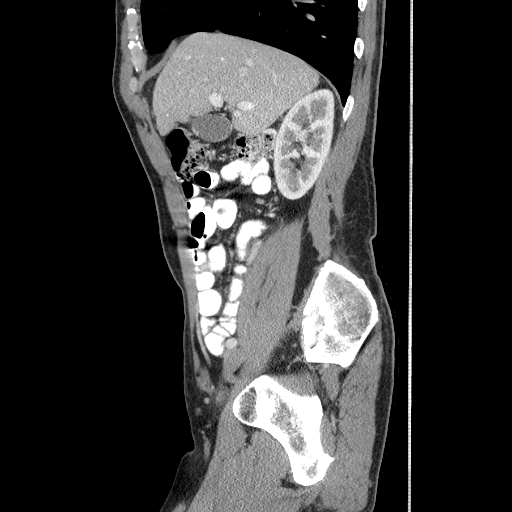
[im 70/150  soft-tissue]
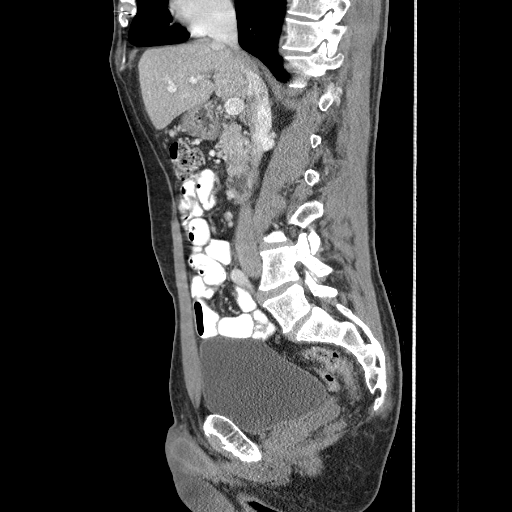

[13 of 36 positions shown; findings below may reference images not displayed]

FINDINGS: Lower chest: Linear atelectasis or scarring at the left base.
Calcifications within the left pericardium. Heart is normal size. No
pleural or pericardial effusion.

Hepatobiliary: No focal hepatic abnormality. Gallbladder
unremarkable.

Pancreas: No focal abnormality or ductal dilatation.

Spleen: No focal abnormality.  Normal size.

Adrenals/Urinary Tract: No adrenal abnormality. No focal renal
abnormality. No stones or hydronephrosis. Urinary bladder is
unremarkable.

Stomach/Bowel: Moderate stool burden. Stomach, large and small bowel
grossly unremarkable.

Vascular/Lymphatic: No evidence of aneurysm or adenopathy.

Reproductive:  No visible focal abnormality.

Other: No free fluid or free air.

Musculoskeletal: No acute bony abnormality or focal bone lesion.
Degenerative disc disease in the lower lumbar spine.
IMPRESSION: No acute findings in the abdomen or pelvis.

Moderate stool burden.

## 2019-10-24 ENCOUNTER — Telehealth: Payer: Self-pay | Admitting: Pulmonary Disease

## 2019-10-24 NOTE — Telephone Encounter (Signed)
Called and spoke with pt's wife [name]. Pt is scheduled for a self referral consult with Dr. Tonia Brooms 4/28 due to collapsed lung. [name] stated that pt has had CTs prior which has showed this in the past and she wanted to know if we wanted them to get ahold of those images prior to pt's appt. In the care everywhere section of pt's chart, you can see the written info on the CT pt had done at [facility] back in 02/2018 but I stated to [name] if they are able to get a disc of that scan, that would be very helpful for [place] to have in case Dr. Tonia Brooms wanted to repeat the CT to then compare it with that one that pt had done. [name] verbalized understanding and stated they would call [facility]. Nothing further needed.

## 2019-11-06 ENCOUNTER — Ambulatory Visit: Payer: Managed Care, Other (non HMO) | Admitting: Pulmonary Disease

## 2019-11-06 ENCOUNTER — Encounter: Payer: Self-pay | Admitting: Pulmonary Disease

## 2019-11-06 ENCOUNTER — Other Ambulatory Visit: Payer: Self-pay

## 2019-11-06 VITALS — BP 106/64 | HR 69 | Temp 98.6°F | Ht 69.0 in | Wt 163.2 lb

## 2019-11-06 DIAGNOSIS — Z8701 Personal history of pneumonia (recurrent): Secondary | ICD-10-CM

## 2019-11-06 DIAGNOSIS — Z9889 Other specified postprocedural states: Secondary | ICD-10-CM

## 2019-11-06 NOTE — Patient Instructions (Signed)
Thank you for visiting Dr. Carisma Troupe at [facility]. Today we recommend the following:  Return if symptoms worsen or fail to improve.    Please do your part to reduce the spread of COVID-19.  

## 2019-11-06 NOTE — Progress Notes (Signed)
Synopsis: Referred in April 2021 for SOB by No ref. provider found  Subjective:   PATIENT ID: Zachary Lara GENDER: male DOB: [birth year], MRN: [id number]  Chief Complaint  Patient presents with  . Consult    no cough, SOB with activity has resolved since this winter     PMH of calcification of pericardium, s/p pericardectomy at [facility] in [place] in 2013. He had recurrent effusions/blood collection in the pleural space. He was followed up at [facility] in 2013 2 months after heart surgery and had fluid removed and what sounds like a VAT + pleurodesis (we will need to get records). He felt like he had pneumonia in Jan 2021. He usually never runs a fever. He runs out of energy and very fatigued. He had CT imaging in 2019. Now he feels back to normal. Occupation: he works for IT at [employer]. For the past year he has been working at home. He has had both vaccines for covid at this time. He is returning to inperson work this Monday.  Patient has not had any additional respiratory symptoms since the event in January.  At this time asymptomatic.   Past Medical History:  Diagnosis Date  . Arthritis   . Dizziness    SINCE TAKING FLUID PILL  . Granulomatous hepatitis   . H/O hiatal hernia   . Hemorrhoids   . Pericarditis    H/O, chronic calcific,normal stress cardiolite  10/20/2003 EF 52%  . Pulmonary edema    CURRENTLY HAS ABNORMAL CXR - FOLLOWED BY CARDIOLOGIST AT [facility]  . Shortness of breath    WITH EXERTION -   . Ulcer    NON-BLEEDING STOMACH ULCER  . Ventricular fibrillation (HCC)    POST OP PARICARDECTOMY - NO EPISODES SINCE     Family History  Problem Relation Age of Onset  . Heart attack Mother        multiple  . Stroke Neg Hx      Past Surgical History:  Procedure Laterality Date  . BAND HEMORRHOIDECTOMY    . EXCISIONAL HEMORRHOIDECTOMY    . LIVER BIOPSY    . LUNG SURGERY  2013  . pericardial stripping  08/19/11   [facility]  . TONSILLECTOMY      Social History    Socioeconomic History  . Marital status: Married    Spouse name: [name]  . Number of children: Not on file  . Years of education: 33  . Highest education level: Not on file  Occupational History  . Occupation: Civil Service fast streamer  Tobacco Use  . Smoking status: Never Smoker  . Smokeless tobacco: Never Used  Substance and Sexual Activity  . Alcohol use: No  . Drug use: No  . Sexual activity: Yes    Partners: Female  Other Topics Concern  . Not on file  Social History Narrative   Lives with wife   Caffeine use: 2 cups coffee every morning   Social Determinants of Health   Financial Resource Strain:   . Difficulty of Paying Living Expenses:   Food Insecurity:   . Worried About Programme researcher, broadcasting/film/video in the Last Year:   . Barista in the Last Year:   Transportation Needs:   . Freight forwarder (Medical):   Marland Kitchen Lack of Transportation (Non-Medical):   Physical Activity:   . Days of Exercise per Week:   . Minutes of Exercise per Session:   Stress:   . Feeling of Stress :  Social Connections:   . Frequency of Communication with Friends and Family:   . Frequency of Social Gatherings with Friends and Family:   . Attends Religious Services:   . Active Member of Clubs or Organizations:   . Attends Banker Meetings:   Marland Kitchen Marital Status:   Intimate Partner Violence:   . Fear of Current or Ex-Partner:   . Emotionally Abused:   Marland Kitchen Physically Abused:   . Sexually Abused:      Allergies  Allergen Reactions  . Bio-Flax Hives  . Cottonseed Oil Hives  . Other     Dark chocolate      Outpatient Medications Prior to Visit  Medication Sig Dispense Refill  . acyclovir (ZOVIRAX) 400 MG tablet Take 400 mg by mouth.    . cyclobenzaprine (FLEXERIL) 10 MG tablet Take 1 tablet (10 mg total) by mouth 3 (three) times daily as needed for muscle spasms. 90 tablet 3  . fish oil-omega-3 fatty acids 1000 MG capsule Take 2 g by mouth daily.    . Multiple Vitamin  (MULITIVITAMIN WITH MINERALS) TABS Take 1 tablet by mouth daily with breakfast.    . nortriptyline (PAMELOR) 10 MG capsule Take 2 capsules (20 mg total) by mouth at bedtime. Start with one pill at night. Can increase to 2 pills at night in 1-2 weeks. 90 capsule 11  . omeprazole (PRILOSEC) 40 MG capsule Take 40 mg by mouth 2 (two) times daily.  2   Facility-Administered Medications Prior to Visit  Medication Dose Route Frequency Provider Last Rate Last Admin  . gadopentetate dimeglumine (MAGNEVIST) injection 17 mL  17 mL Intravenous Once PRN Anson Fret, MD        Review of Systems  Constitutional: Negative for chills, fever, malaise/fatigue and weight loss.  HENT: Negative for hearing loss, sore throat and tinnitus.   Eyes: Negative for blurred vision and double vision.  Respiratory: Negative for cough, hemoptysis, sputum production, shortness of breath, wheezing and stridor.   Cardiovascular: Negative for chest pain, palpitations, orthopnea, leg swelling and PND.  Gastrointestinal: Negative for abdominal pain, constipation, diarrhea, heartburn, nausea and vomiting.  Genitourinary: Negative for dysuria, hematuria and urgency.  Musculoskeletal: Negative for joint pain and myalgias.  Skin: Negative for itching and rash.  Neurological: Negative for dizziness, tingling, weakness and headaches.  Endo/Heme/Allergies: Negative for environmental allergies. Does not bruise/bleed easily.  Psychiatric/Behavioral: Negative for depression. The patient is not nervous/anxious and does not have insomnia.   All other systems reviewed and are negative.    Objective:  Physical Exam Vitals reviewed.  Constitutional:      General: He is not in acute distress.    Appearance: He is well-developed.  HENT:     Head: Normocephalic and atraumatic.  Eyes:     General: No scleral icterus.    Conjunctiva/sclera: Conjunctivae normal.     Pupils: Pupils are equal, round, and reactive to light.  Neck:      Vascular: No JVD.     Trachea: No tracheal deviation.  Cardiovascular:     Rate and Rhythm: Normal rate and regular rhythm.     Heart sounds: Normal heart sounds. No murmur.  Pulmonary:     Effort: Pulmonary effort is normal. No tachypnea, accessory muscle usage or respiratory distress.     Breath sounds: Normal breath sounds. No stridor. No wheezing, rhonchi or rales.  Abdominal:     General: Bowel sounds are normal. There is no distension.     Palpations: Abdomen  is soft.     Tenderness: There is no abdominal tenderness.  Musculoskeletal:        General: No tenderness.     Cervical back: Neck supple.  Lymphadenopathy:     Cervical: No cervical adenopathy.  Skin:    General: Skin is warm and dry.     Capillary Refill: Capillary refill takes less than 2 seconds.     Findings: No rash.  Neurological:     Mental Status: He is alert and oriented to person, place, and time.  Psychiatric:        Behavior: Behavior normal.      Vitals:   11/06/19 1528  BP: 106/64  Pulse: 69  Temp: 98.6 F (37 C)  TempSrc: Temporal  SpO2: 97%  Weight: 163 lb 3.2 oz (74 kg)  Height: 5\' 9"  (1.753 m)   97% on RA BMI Readings from Last 3 Encounters:  11/06/19 24.10 kg/m  01/13/16 24.79 kg/m  10/13/15 25.83 kg/m   Wt Readings from Last 3 Encounters:  11/06/19 163 lb 3.2 oz (74 kg)  01/13/16 172 lb 12.8 oz (78.4 kg)  10/13/15 180 lb (81.6 kg)     CBC    Component Value Date/Time   WBC 7.0 10/17/2011 0950   RBC 5.29 10/17/2011 0950   HGB 16.5 10/17/2011 0950   HCT 47.8 10/17/2011 0950   PLT 222 10/17/2011 0950   MCV 90.4 10/17/2011 0950   MCH 31.2 10/17/2011 0950   MCHC 34.5 10/17/2011 0950   RDW 12.4 10/17/2011 0950   LYMPHSABS 1.4 10/17/2011 0950   MONOABS 0.7 10/17/2011 0950   EOSABS 0.6 10/17/2011 0950   BASOSABS 0.0 10/17/2011 0950    Chest Imaging: CT imaging 2019 [facility] CD brought with patient -Reviewed images today in the office.  No significant  abnormality.  No infiltrate.  Small subpleural left upper lobe density but patient did have a history of prior thoracic surgery. The patient's images have been independently reviewed by me.    Pulmonary Functions Testing Results: No flowsheet data found.  FeNO: none  Pathology: none  Echocardiogram: none  Heart Catheterization: none    Assessment & Plan:     ICD-10-CM   1. History of thoracic surgery  Z98.890   2. 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  He also developed what appears to be recurrent effusion afterward?  Post pericardiotomy syndrome?,  At that time followed at [facility] after the surgery underwent thoracotomy with decortication of the parietal pleura in May 2013.  He has a history of pneumonia that was self diagnosed in January 2021.  Symptoms lasted for a few days he did not have any fevers but he felt very fatigued during this time.  Denied sputum production.  Symptoms resolved on its own and he is no longer having trouble.  Plan: I think patient can follow-up at this point as needed.  He currently has no respiratory complaints.  He did ask about a pneumonia vaccine. He does not have any of the significant risk factors for the need of a pneumonia vaccine before age 6. At age 33 he can start his regular pneumonia vaccine plans. Recommended getting his Covid vaccine which he already has Recommended regular flu vaccine coming again this fall. Patient to return to our clinic as needed.     Current Outpatient Medications:  .  acyclovir (ZOVIRAX) 400 MG tablet, Take 400 mg by mouth., Disp: , Rfl:  .  cyclobenzaprine (FLEXERIL) 10 MG tablet, Take 1 tablet (10 mg total) by mouth 3 (three) times daily as needed for muscle spasms., Disp: 90 tablet, Rfl: 3 .  fish oil-omega-3 fatty acids 1000 MG capsule, Take 2 g by  mouth daily., Disp: , Rfl:  .  Multiple Vitamin (MULITIVITAMIN WITH MINERALS) TABS, Take 1 tablet by mouth daily with breakfast., Disp: , Rfl:  .  nortriptyline (PAMELOR) 10 MG capsule, Take 2 capsules (20 mg total) by mouth at bedtime. Start with one pill at night. Can increase to 2 pills at night in 1-2 weeks., Disp: 90 capsule, Rfl: 11 .  omeprazole (PRILOSEC) 40 MG capsule, Take 40 mg by mouth 2 (two) times daily., Disp: , Rfl: 2 No current facility-administered medications for this visit.  Facility-Administered Medications Ordered in Other Visits:  .  gadopentetate dimeglumine (MAGNEVIST) injection 17 mL, 17 mL, Intravenous, Once PRN, Anson Fret, MD   Josephine Igo, DO [facility] 11/06/2019 3:48 PM

## 2020-06-18 ENCOUNTER — Telehealth: Payer: Self-pay | Admitting: Specialist

## 2020-06-18 ENCOUNTER — Telehealth: Payer: Self-pay | Admitting: Physical Medicine and Rehabilitation

## 2020-06-18 NOTE — Telephone Encounter (Signed)
Patient's wife [name] called requesting a call back from [place] about new back patient. Will Dr. Otelia Sergeant accept new patient for Jan 2022. Please call her at [phone].

## 2020-06-18 NOTE — Telephone Encounter (Signed)
error 

## 2020-06-18 NOTE — Telephone Encounter (Signed)
Ok to sched for NP

## 2020-06-18 NOTE — Telephone Encounter (Signed)
Sched for 07/15/20 @ 1030, [name] is aware of this.

## 2020-07-15 ENCOUNTER — Ambulatory Visit: Payer: Self-pay

## 2020-07-15 ENCOUNTER — Ambulatory Visit: Payer: Managed Care, Other (non HMO) | Admitting: Specialist

## 2020-07-15 ENCOUNTER — Encounter: Payer: Self-pay | Admitting: Specialist

## 2020-07-15 VITALS — BP 126/76 | HR 72 | Ht 70.0 in | Wt 174.4 lb

## 2020-07-15 DIAGNOSIS — M439 Deforming dorsopathy, unspecified: Secondary | ICD-10-CM | POA: Diagnosis not present

## 2020-07-15 DIAGNOSIS — M4156 Other secondary scoliosis, lumbar region: Secondary | ICD-10-CM | POA: Diagnosis not present

## 2020-07-15 NOTE — Patient Instructions (Signed)
May use ice or moist heat for pain. Weight loss is of benefit. Best medication for lumbar disc disease is arthritis medications like motrin,  and naprosyn. Exercise is important to improve your indurance and does allow people to function better inspite of back pain. Follow up scoliosis radiographs in 2 years. Core strengthening and avoid High Impact aerobic exercises.   Low Back Sprain or Strain Rehab Ask your health care provider which exercises are safe for you. Do exercises exactly as told by your health care provider and adjust them as directed. It is normal to feel mild stretching, pulling, tightness, or discomfort as you do these exercises. Stop right away if you feel sudden pain or your pain gets worse. Do not begin these exercises until told by your health care provider. Stretching and range-of-motion exercises These exercises warm up your muscles and joints and improve the movement and flexibility of your back. These exercises also help to relieve pain, numbness, and tingling. Lumbar rotation  1. Lie on your back on a firm surface and bend your knees. 2. Straighten your arms out to your sides so each arm forms a 90-degree angle (right angle) with a side of your body. 3. Slowly move (rotate) both of your knees to one side of your body until you feel a stretch in your lower back (lumbar). Try not to let your shoulders lift off the floor. 4. Hold this position for __________ seconds. 5. Tense your abdominal muscles and slowly move your knees back to the starting position. 6. Repeat this exercise on the other side of your body. Repeat __________ times. Complete this exercise __________ times a day. Single knee to chest  1. Lie on your back on a firm surface with both legs straight. 2. Bend one of your knees. Use your hands to move your knee up toward your chest until you feel a gentle stretch in your lower back and buttock. ? Hold your leg in this position by holding on to the front of  your knee. ? Keep your other leg as straight as possible. 3. Hold this position for __________ seconds. 4. Slowly return to the starting position. 5. Repeat with your other leg. Repeat __________ times. Complete this exercise __________ times a day. Prone extension on elbows  1. Lie on your abdomen on a firm surface (prone position). 2. Prop yourself up on your elbows. 3. Use your arms to help lift your chest up until you feel a gentle stretch in your abdomen and your lower back. ? This will place some of your body weight on your elbows. If this is uncomfortable, try stacking pillows under your chest. ? Your hips should stay down, against the surface that you are lying on. Keep your hip and back muscles relaxed. 4. Hold this position for __________ seconds. 5. Slowly relax your upper body and return to the starting position. Repeat __________ times. Complete this exercise __________ times a day. Strengthening exercises These exercises build strength and endurance in your back. Endurance is the ability to use your muscles for a long time, even after they get tired. Pelvic tilt This exercise strengthens the muscles that lie deep in the abdomen. 1. Lie on your back on a firm surface. Bend your knees and keep your feet flat on the floor. 2. Tense your abdominal muscles. Tip your pelvis up toward the ceiling and flatten your lower back into the floor. ? To help with this exercise, you may place a small towel under your lower back  and try to push your back into the towel. 3. Hold this position for __________ seconds. 4. Let your muscles relax completely before you repeat this exercise. Repeat __________ times. Complete this exercise __________ times a day. Alternating arm and leg raises  1. Get on your hands and knees on a firm surface. If you are on a hard floor, you may want to use padding, such as an exercise mat, to cushion your knees. 2. Line up your arms and legs. Your hands should be  directly below your shoulders, and your knees should be directly below your hips. 3. Lift your left leg behind you. At the same time, raise your right arm and straighten it in front of you. ? Do not lift your leg higher than your hip. ? Do not lift your arm higher than your shoulder. ? Keep your abdominal and back muscles tight. ? Keep your hips facing the ground. ? Do not arch your back. ? Keep your balance carefully, and do not hold your breath. 4. Hold this position for __________ seconds. 5. Slowly return to the starting position. 6. Repeat with your right leg and your left arm. Repeat __________ times. Complete this exercise __________ times a day. Abdominal set with straight leg raise  1. Lie on your back on a firm surface. 2. Bend one of your knees and keep your other leg straight. 3. Tense your abdominal muscles and lift your straight leg up, 4-6 inches (10-15 cm) off the ground. 4. Keep your abdominal muscles tight and hold this position for __________ seconds. ? Do not hold your breath. ? Do not arch your back. Keep it flat against the ground. 5. Keep your abdominal muscles tense as you slowly lower your leg back to the starting position. 6. Repeat with your other leg. Repeat __________ times. Complete this exercise __________ times a day. Single leg lower with bent knees 1. Lie on your back on a firm surface. 2. Tense your abdominal muscles and lift your feet off the floor, one foot at a time, so your knees and hips are bent in 90-degree angles (right angles). ? Your knees should be over your hips and your lower legs should be parallel to the floor. 3. Keeping your abdominal muscles tense and your knee bent, slowly lower one of your legs so your toe touches the ground. 4. Lift your leg back up to return to the starting position. ? Do not hold your breath. ? Do not let your back arch. Keep your back flat against the ground. 5. Repeat with your other leg. Repeat __________  times. Complete this exercise __________ times a day. Posture and body mechanics Good posture and healthy body mechanics can help to relieve stress in your body's tissues and joints. Body mechanics refers to the movements and positions of your body while you do your daily activities. Posture is part of body mechanics. Good posture means:  Your spine is in its natural S-curve position (neutral).  Your shoulders are pulled back slightly.  Your head is not tipped forward. Follow these guidelines to improve your posture and body mechanics in your everyday activities. Standing   When standing, keep your spine neutral and your feet about hip width apart. Keep a slight bend in your knees. Your ears, shoulders, and hips should line up.  When you do a task in which you stand in one place for a long time, place one foot up on a stable object that is 2-4 inches (5-10 cm) high,  such as a footstool. This helps keep your spine neutral. Sitting   When sitting, keep your spine neutral and keep your feet flat on the floor. Use a footrest, if necessary, and keep your thighs parallel to the floor. Avoid rounding your shoulders, and avoid tilting your head forward.  When working at a desk or a computer, keep your desk at a height where your hands are slightly lower than your elbows. Slide your chair under your desk so you are close enough to maintain good posture.  When working at a computer, place your monitor at a height where you are looking straight ahead and you do not have to tilt your head forward or downward to look at the screen. Resting  When lying down and resting, avoid positions that are most painful for you.  If you have pain with activities such as sitting, bending, stooping, or squatting, lie in a position in which your body does not bend very much. For example, avoid curling up on your side with your arms and knees near your chest (fetal position).  If you have pain with activities such as  standing for a long time or reaching with your arms, lie with your spine in a neutral position and bend your knees slightly. Try the following positions: ? Lying on your side with a pillow between your knees. ? Lying on your back with a pillow under your knees. Lifting   When lifting objects, keep your feet at least shoulder width apart and tighten your abdominal muscles.  Bend your knees and hips and keep your spine neutral. It is important to lift using the strength of your legs, not your back. Do not lock your knees straight out.  Always ask for help to lift heavy or awkward objects. This information is not intended to replace advice given to you by your health care provider. Make sure you discuss any questions you have with your health care provider. Document Revised: 10/19/2018 Document Reviewed: 07/19/2018 Elsevier Patient Education  [place].

## 2020-07-15 NOTE — Progress Notes (Signed)
Office Visit Note   Patient: Zachary Lara           Date of Birth: 1961/06/23           MRN: [id number] Visit Date: 07/15/2020              Requested by: No referring provider defined for this encounter. PCP: Patient, No Pcp Per   Assessment & Plan: Visit Diagnoses:  1. Curvature of spine   2. Other secondary scoliosis, lumbar region     Plan:   May use ice or moist heat for pain. Weight loss is of benefit. Best medication for lumbar disc disease is arthritis medications like motrin,  and naprosyn. Exercise is important to improve your indurance and does allow people to function better inspite of back pain. Follow up scoliosis radiographs in 2 years. Core strengthening and avoid High Impact aerobic exercises.   Low Back Sprain or Strain Rehab Ask your health care provider which exercises are safe for you. Do exercises exactly as told by your health care provider and adjust them as directed. It is normal to feel mild stretching, pulling, tightness, or discomfort as you do these exercises. Stop right away if you feel sudden pain or your pain gets worse. Do not begin these exercises until told by your health care provider. Stretching and range-of-motion exercises These exercises warm up your muscles and joints and improve the movement and flexibility of your back. These exercises also help to relieve pain, numbness, and tingling. Lumbar rotation  1. Lie on your back on a firm surface and bend your knees. 2. Straighten your arms out to your sides so each arm forms a 90-degree angle (right angle) with a side of your body. 3. Slowly move (rotate) both of your knees to one side of your body until you feel a stretch in your lower back (lumbar). Try not to let your shoulders lift off the floor. 4. Hold this position for __________ seconds. 5. Tense your abdominal muscles and slowly move your knees back to the starting position. 6. Repeat this exercise on the other side of your  body. Repeat __________ times. Complete this exercise __________ times a day. Single knee to chest  1. Lie on your back on a firm surface with both legs straight. 2. Bend one of your knees. Use your hands to move your knee up toward your chest until you feel a gentle stretch in your lower back and buttock. ? Hold your leg in this position by holding on to the front of your knee. ? Keep your other leg as straight as possible. 3. Hold this position for __________ seconds. 4. Slowly return to the starting position. 5. Repeat with your other leg. Repeat __________ times. Complete this exercise __________ times a day. Prone extension on elbows  1. Lie on your abdomen on a firm surface (prone position). 2. Prop yourself up on your elbows. 3. Use your arms to help lift your chest up until you feel a gentle stretch in your abdomen and your lower back. ? This will place some of your body weight on your elbows. If this is uncomfortable, try stacking pillows under your chest. ? Your hips should stay down, against the surface that you are lying on. Keep your hip and back muscles relaxed. 4. Hold this position for __________ seconds. 5. Slowly relax your upper body and return to the starting position. Repeat __________ times. Complete this exercise __________ times a day. Strengthening exercises These exercises  build strength and endurance in your back. Endurance is the ability to use your muscles for a long time, even after they get tired. Pelvic tilt This exercise strengthens the muscles that lie deep in the abdomen. 1. Lie on your back on a firm surface. Bend your knees and keep your feet flat on the floor. 2. Tense your abdominal muscles. Tip your pelvis up toward the ceiling and flatten your lower back into the floor. ? To help with this exercise, you may place a small towel under your lower back and try to push your back into the towel. 3. Hold this position for __________ seconds. 4. Let your  muscles relax completely before you repeat this exercise. Repeat __________ times. Complete this exercise __________ times a day. Alternating arm and leg raises  1. Get on your hands and knees on a firm surface. If you are on a hard floor, you may want to use padding, such as an exercise mat, to cushion your knees. 2. Line up your arms and legs. Your hands should be directly below your shoulders, and your knees should be directly below your hips. 3. Lift your left leg behind you. At the same time, raise your right arm and straighten it in front of you. ? Do not lift your leg higher than your hip. ? Do not lift your arm higher than your shoulder. ? Keep your abdominal and back muscles tight. ? Keep your hips facing the ground. ? Do not arch your back. ? Keep your balance carefully, and do not hold your breath. 4. Hold this position for __________ seconds. 5. Slowly return to the starting position. 6. Repeat with your right leg and your left arm. Repeat __________ times. Complete this exercise __________ times a day. Abdominal set with straight leg raise  1. Lie on your back on a firm surface. 2. Bend one of your knees and keep your other leg straight. 3. Tense your abdominal muscles and lift your straight leg up, 4-6 inches (10-15 cm) off the ground. 4. Keep your abdominal muscles tight and hold this position for __________ seconds. ? Do not hold your breath. ? Do not arch your back. Keep it flat against the ground. 5. Keep your abdominal muscles tense as you slowly lower your leg back to the starting position. 6. Repeat with your other leg. Repeat __________ times. Complete this exercise __________ times a day. Single leg lower with bent knees 1. Lie on your back on a firm surface. 2. Tense your abdominal muscles and lift your feet off the floor, one foot at a time, so your knees and hips are bent in 90-degree angles (right angles). ? Your knees should be over your hips and your lower  legs should be parallel to the floor. 3. Keeping your abdominal muscles tense and your knee bent, slowly lower one of your legs so your toe touches the ground. 4. Lift your leg back up to return to the starting position. ? Do not hold your breath. ? Do not let your back arch. Keep your back flat against the ground. 5. Repeat with your other leg. Repeat __________ times. Complete this exercise __________ times a day. Posture and body mechanics Good posture and healthy body mechanics can help to relieve stress in your body's tissues and joints. Body mechanics refers to the movements and positions of your body while you do your daily activities. Posture is part of body mechanics. Good posture means:  Your spine is in its natural S-curve  position (neutral).  Your shoulders are pulled back slightly.  Your head is not tipped forward. Follow these guidelines to improve your posture and body mechanics in your everyday activities. Standing   When standing, keep your spine neutral and your feet about hip width apart. Keep a slight bend in your knees. Your ears, shoulders, and hips should line up.  When you do a task in which you stand in one place for a long time, place one foot up on a stable object that is 2-4 inches (5-10 cm) high, such as a footstool. This helps keep your spine neutral. Sitting   When sitting, keep your spine neutral and keep your feet flat on the floor. Use a footrest, if necessary, and keep your thighs parallel to the floor. Avoid rounding your shoulders, and avoid tilting your head forward.  When working at a desk or a computer, keep your desk at a height where your hands are slightly lower than your elbows. Slide your chair under your desk so you are close enough to maintain good posture.  When working at a computer, place your monitor at a height where you are looking straight ahead and you do not have to tilt your head forward or downward to look at the  screen. Resting  When lying down and resting, avoid positions that are most painful for you.  If you have pain with activities such as sitting, bending, stooping, or squatting, lie in a position in which your body does not bend very much. For example, avoid curling up on your side with your arms and knees near your chest (fetal position).  If you have pain with activities such as standing for a long time or reaching with your arms, lie with your spine in a neutral position and bend your knees slightly. Try the following positions: ? Lying on your side with a pillow between your knees. ? Lying on your back with a pillow under your knees. Lifting   When lifting objects, keep your feet at least shoulder width apart and tighten your abdominal muscles.  Bend your knees and hips and keep your spine neutral. It is important to lift using the strength of your legs, not your back. Do not lock your knees straight out.  Always ask for help to lift heavy or awkward objects. This information is not intended to replace advice given to you by your health care provider. Make sure you discuss any questions you have with your health care provider. Document Revised: 10/19/2018 Document Reviewed: 07/19/2018 Elsevier Patient Education  2020 [employer].  Follow-Up Instructions: No follow-ups on file.   Orders:  Orders Placed This Encounter  Procedures  . XR SCOLIOSIS EVAL COMPLETE SPINE 2 OR 3 VIEWS   No orders of the defined types were placed in this encounter.     Procedures: No procedures performed   Clinical Data: No additional findings.   Subjective: Chief Complaint  Patient presents with  . Spine - Pain    curvature    60 year old male with history of previous cardiac condition and    Review of Systems  Constitutional: Negative.   HENT: Negative.   Eyes: Negative.   Respiratory: Negative.   Cardiovascular: Negative.   Gastrointestinal: Negative.   Endocrine: Negative.    Genitourinary: Negative.   Musculoskeletal: Negative.   Skin: Negative.   Allergic/Immunologic: Negative.   Neurological: Negative.   Hematological: Negative.   Psychiatric/Behavioral: Negative.      Objective: Vital Signs: BP 126/76 (  BP Location: Left Arm, Patient Position: Sitting)   Pulse 72   Ht 5\' 10"  (1.778 m)   Wt 174 lb 6.4 oz (79.1 kg)   BMI 25.02 kg/m   Physical Exam Musculoskeletal:     Lumbar back: Negative right straight leg raise test and negative left straight leg raise test.     Back Exam   Tenderness  The patient is experiencing tenderness in the lumbar.  Range of Motion  Extension: abnormal  Flexion: abnormal  Lateral bend right: abnormal  Lateral bend left: abnormal  Rotation right: abnormal  Rotation left: abnormal   Muscle Strength  Right Quadriceps:  5/5  Left Quadriceps:  5/5  Right Hamstrings:  5/5  Left Hamstrings:  5/5   Tests  Straight leg raise right: negative Straight leg raise left: negative  Other  Toe walk: normal Heel walk: normal      Specialty Comments:  No specialty comments available.  Imaging: No results found.   PMFS History: Patient Active Problem List   Diagnosis Date Noted  . Unilateral occipital headache 10/18/2015  . Transient vision disturbance 08/27/2015  . Pulsatile tinnitus 08/27/2015  . New onset of headaches after age 5 08/27/2015  . Numbness 08/27/2015  . Eosinophilia 04/29/2014  . Pericarditis   . Internal bleeding hemorrhoids 10/06/2011  . 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 UNSPECIFIED DISEASE OF PERICARDIUM 09/16/2010  . FATIGUE 09/16/2010  . NONSPECIFIC ABNORMAL RESULTS LIVR FUNCTION STUDY 09/16/2010  . COLONIC POLYPS, HX OF 09/16/2010   Past Medical History:  Diagnosis Date  . Arthritis   . Dizziness    SINCE TAKING FLUID PILL  . Granulomatous hepatitis   . H/O hiatal hernia   . Hemorrhoids   . Pericarditis    H/O, chronic calcific,normal stress cardiolite  10/20/2003  EF 52%  . Pulmonary edema    CURRENTLY HAS ABNORMAL CXR - FOLLOWED BY CARDIOLOGIST AT [facility]  . Shortness of breath    WITH EXERTION -   . Ulcer    NON-BLEEDING STOMACH ULCER  . Ventricular fibrillation (HCC)    POST OP PARICARDECTOMY - NO EPISODES SINCE    Family History  Problem Relation Age of Onset  . Heart attack Mother        multiple  . Stroke Neg Hx     Past Surgical History:  Procedure Laterality Date  . BAND HEMORRHOIDECTOMY    . EXCISIONAL HEMORRHOIDECTOMY    . LIVER BIOPSY    . LUNG SURGERY  2013  . pericardial stripping  08/19/11   [facility]  . TONSILLECTOMY     Social History   Occupational History  . Occupation: Print production planner  Tobacco Use  . Smoking status: Never Smoker  . Smokeless tobacco: Never Used  Substance and Sexual Activity  . Alcohol use: No  . Drug use: No  . Sexual activity: Yes    Partners: Female

## 2020-09-01 ENCOUNTER — Other Ambulatory Visit: Payer: Self-pay

## 2020-09-01 ENCOUNTER — Ambulatory Visit: Payer: Managed Care, Other (non HMO) | Admitting: Cardiovascular Disease

## 2020-09-01 ENCOUNTER — Encounter: Payer: Self-pay | Admitting: Cardiovascular Disease

## 2020-09-01 VITALS — BP 112/70 | HR 70 | Ht 69.0 in | Wt 165.0 lb

## 2020-09-01 DIAGNOSIS — E785 Hyperlipidemia, unspecified: Secondary | ICD-10-CM | POA: Diagnosis not present

## 2020-09-01 DIAGNOSIS — I311 Chronic constrictive pericarditis: Secondary | ICD-10-CM

## 2020-09-01 NOTE — Assessment & Plan Note (Signed)
Zachary Lara is referred to be established in my practice having been cared for at [facility] for the number of years because because of convenience and proximity.  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  Soon after that he had what sounds like a VATS procedure with pleurodesis because of recurrent pleural effusions.  Chest CT performed at [facility] 06/16/2020 was unremarkable and 2D echocardiogram revealed an EF of 40 to 45% with normal valvular function and no pericardial effusion.  He is very active, exercises strenuously without significant limitation.

## 2020-09-01 NOTE — Progress Notes (Signed)
09/01/2020 Zachary Lara   01/08/1961  [id number]  Primary Physician Patient, No Pcp Per Primary Cardiologist: Runell Gess MD Zachary Lara, [place]  HPI:  Zachary Lara is a 60 y.o. thin and fit appearing married Caucasian male father of 3 (2 stepchildren), grandfather 3 grandchildren who is accompanied virtually by his wife Zachary Lara on the phone today.  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  He works in Consulting civil engineer.  He drinks socially.  He has had 2 brothers that you have had stents.  He is never had a heart attack or stroke.  He denies chest pain or shortness of breath.  He exercises vigorously without limitation.  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  He also had a VATS procedure and what sounds like pleurodesis subsequent to that.  He has been followed at [facility] by Dr. Hyacinth Meeker every other year and is here to be established in my practice.   Current Meds  Medication Sig  . acyclovir (ZOVIRAX) 400 MG tablet Take 400 mg by mouth.  . fish oil-omega-3 fatty acids 1000 MG capsule Take 2 g by mouth daily.  . Multiple Vitamin (MULITIVITAMIN WITH MINERALS) TABS Take 1 tablet by mouth daily with breakfast.  . omeprazole (PRILOSEC) 40 MG capsule Take 40 mg by mouth 2 (two) times daily.     Allergies  Allergen Reactions  . Bio-Flax Hives  . Cottonseed Oil Hives  . Other     Dark chocolate     Social History   Socioeconomic History  . Marital status: Married    Spouse name: Zachary Lara  . Number of children: Not on file  . Years of education: 100  . Highest education level: Not on file  Occupational History  . Occupation: Civil Service fast streamer  Tobacco Use  . Smoking status: Never Smoker  . Smokeless tobacco: Never Used  Substance and Sexual Activity   . Alcohol use: No  . Drug use: No  . Sexual activity: Yes    Partners: Female  Other Topics Concern  . Not on file  Social History Narrative   Lives with wife   Caffeine use: 2 cups coffee every morning   Social Determinants of Health   Financial Resource Strain: Not on file  Food Insecurity: Not on file  Transportation Needs: Not on file  Physical Activity: Not on file  Stress: Not on file  Social Connections: Not on file  Intimate Partner Violence: Not on file     Review of Systems: General: negative for chills, fever, night sweats or weight changes.  Cardiovascular: negative for chest pain, dyspnea on exertion, edema, orthopnea, palpitations, paroxysmal nocturnal dyspnea or shortness of breath Dermatological: negative for rash Respiratory: negative for cough or wheezing Urologic: negative for hematuria Abdominal: negative for nausea, vomiting, diarrhea, bright red blood per rectum, melena, or hematemesis Neurologic: negative for visual changes, syncope, or dizziness All other systems reviewed and are otherwise negative except as noted above.    Blood pressure 112/70, pulse 70, height 5\' 9"  (1.753 m), weight 165 lb (74.8 kg).  General appearance: alert and no distress Neck: no adenopathy, no carotid bruit, no JVD, supple, symmetrical, trachea midline and thyroid not enlarged, symmetric, no tenderness/mass/nodules Lungs: clear to auscultation bilaterally Heart: regular rate and rhythm, S1, S2  normal, no murmur, click, rub or gallop Extremities: extremities normal, atraumatic, no cyanosis or edema Pulses: 2+ and symmetric Skin: Skin color, texture, turgor normal. No rashes or lesions Neurologic: Alert and oriented X 3, normal strength and tone. Normal symmetric reflexes. Normal coordination and gait  EKG sinus rhythm at 70 with nonspecific ST and T wave changes.  Personally reviewed this EKG.  ASSESSMENT AND PLAN:   Pericarditis [name] is referred to be  established in my practice having been cared for at [facility] for the number of years because because of convenience and proximity.  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  Soon after that he had what sounds like a VATS procedure with pleurodesis because of recurrent pleural effusions.  Chest CT performed at [facility] 06/16/2020 was unremarkable and 2D echocardiogram revealed an EF of 40 to 45% with normal valvular function and no pericardial effusion.  He is very active, exercises strenuously without significant limitation.      Runell Gess MD FACP,FACC,FAHA, [facility] 09/01/2020 10:03 AM

## 2020-09-01 NOTE — Patient Instructions (Signed)
Medication Instructions:  Your physician recommends that you continue on your current medications as directed. Please refer to the Current Medication list given to you today.  *If you need a refill on your cardiac medications before your next appointment, please call your pharmacy*   Lab Work: Your physician recommends that you have labs drawn today: Lipid/liver profile  If you have labs (blood work) drawn today and your tests are completely normal, you will receive your results only by: Marland Kitchen MyChart Message (if you have MyChart) OR . A paper copy in the mail If you have any lab test that is abnormal or we need to change your treatment, we will call you to review the results.   Testing/Procedures: Your physician has requested that you have an echocardiogram. Echocardiography is a painless test that uses sound waves to create images of your heart. It provides your doctor with information about the size and shape of your heart and how well your heart's chambers and valves are working. This procedure takes approximately one hour. There are no restrictions for this procedure. This procedure is done at [address].  To be done in November 2022.   Follow-Up: At [facility], you and your health needs are our priority.  As part of our continuing mission to provide you with exceptional heart care, we have created designated Provider Care Teams.  These Care Teams include your primary Cardiologist (physician) and Advanced Practice Providers (APPs -  Physician Assistants and Nurse Practitioners) who all work together to provide you with the care you need, when you need it.  We recommend signing up for the patient portal called "MyChart".  Sign up information is provided on this After Visit Summary.  MyChart is used to connect with patients for Virtual Visits (Telemedicine).  Patients are able to view lab/test results, encounter notes, upcoming appointments, etc.  Non-urgent messages can be  sent to your provider as well.   To learn more about what you can do with MyChart, go to [email or web address].    Your next appointment:   12 month(s)  The format for your next appointment:   In Person  Provider:   Nanetta Batty, MD

## 2020-09-02 LAB — LIPID PANEL
Chol/HDL Ratio: 2.9 ratio (ref 0.0–5.0)
Cholesterol, Total: 121 mg/dL (ref 100–199)
HDL: 42 mg/dL (ref >39)
LDL Chol Calc (NIH): 62 mg/dL (ref 0–99)
Triglycerides: 89 mg/dL (ref 0–149)
VLDL Cholesterol Cal: 17 mg/dL (ref 5–40)

## 2020-09-02 LAB — HEPATIC FUNCTION PANEL
ALT: 24 IU/L (ref 0–44)
AST: 22 IU/L (ref 0–40)
Albumin: 4.5 g/dL (ref 3.8–4.8)
Alkaline Phosphatase: 82 IU/L (ref 44–121)
Bilirubin Total: 0.7 mg/dL (ref 0.0–1.2)
Bilirubin, Direct: 0.21 mg/dL (ref 0.00–0.40)
Total Protein: 7 g/dL (ref 6.0–8.5)

## 2021-03-12 ENCOUNTER — Telehealth: Payer: Self-pay | Admitting: Hematology and Oncology

## 2021-03-12 NOTE — Telephone Encounter (Signed)
Scheduled appt per 9/1 referral. Pt is aware of appt date and time.  

## 2021-03-25 ENCOUNTER — Other Ambulatory Visit: Payer: Self-pay

## 2021-03-25 ENCOUNTER — Inpatient Hospital Stay: Payer: Managed Care, Other (non HMO) | Attending: Hematology and Oncology

## 2021-03-25 ENCOUNTER — Encounter: Payer: Self-pay | Admitting: Hematology and Oncology

## 2021-03-25 ENCOUNTER — Inpatient Hospital Stay: Payer: Managed Care, Other (non HMO) | Admitting: Hematology and Oncology

## 2021-03-25 VITALS — BP 116/71 | HR 80 | Temp 97.3°F | Resp 20 | Wt 163.4 lb

## 2021-03-25 DIAGNOSIS — D696 Thrombocytopenia, unspecified: Secondary | ICD-10-CM | POA: Insufficient documentation

## 2021-03-25 DIAGNOSIS — Z889 Allergy status to unspecified drugs, medicaments and biological substances status: Secondary | ICD-10-CM

## 2021-03-25 DIAGNOSIS — J301 Allergic rhinitis due to pollen: Secondary | ICD-10-CM | POA: Insufficient documentation

## 2021-03-25 DIAGNOSIS — D7219 Other eosinophilia: Secondary | ICD-10-CM | POA: Diagnosis present

## 2021-03-25 LAB — CBC WITH DIFFERENTIAL/PLATELET
Abs Immature Granulocytes: 0.01 10*3/uL (ref 0.00–0.07)
Basophils Absolute: 0 10*3/uL (ref 0.0–0.1)
Basophils Relative: 0 %
Eosinophils Absolute: 0.6 10*3/uL — ABNORMAL HIGH (ref 0.0–0.5)
Eosinophils Relative: 15 %
HCT: 41.7 % (ref 39.0–52.0)
Hemoglobin: 14.9 g/dL (ref 13.0–17.0)
Immature Granulocytes: 0 %
Lymphocytes Relative: 30 %
Lymphs Abs: 1.3 10*3/uL (ref 0.7–4.0)
MCH: 33 pg (ref 26.0–34.0)
MCHC: 35.7 g/dL (ref 30.0–36.0)
MCV: 92.3 fL (ref 80.0–100.0)
Monocytes Absolute: 0.5 10*3/uL (ref 0.1–1.0)
Monocytes Relative: 10 %
Neutro Abs: 1.9 10*3/uL (ref 1.7–7.7)
Neutrophils Relative %: 45 %
Platelets: 141 10*3/uL — ABNORMAL LOW (ref 150–400)
RBC: 4.52 MIL/uL (ref 4.22–5.81)
RDW: 12.7 % (ref 11.5–15.5)
WBC: 4.3 10*3/uL (ref 4.0–10.5)
nRBC: 0 % (ref 0.0–0.2)

## 2021-03-25 NOTE — Assessment & Plan Note (Signed)
He has history of intermittent eosinophilia seen on his blood work over the past 10 years, likely reactive in nature. The patient had confirmed allergic reaction to grass and trees from prior skin test performed by his allergist. 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 He does not fulfill the criteria for myeloproliferative disorder and his absolute eosinophil count has never been over 1.5. I reassured the patient. I recommend over-the-counter allergy medicine such as Pepcid that might help with reducing risk of reflux or esophageal damage I do not feel strongly we need to order accessible work to exclude myeloproliferative disorder. I reassured him and his wife.

## 2021-03-25 NOTE — Assessment & Plan Note (Signed)
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

## 2021-03-25 NOTE — Assessment & Plan Note (Signed)
He has mild thrombocytopenia likely due to recent consumption from bleeding from his GI procedure I told him alcohol intake can also affect platelet count We discussed the risk and benefits of close surveillance He would like to return here in a month for further follow-up with repeat CBC

## 2021-03-25 NOTE — Progress Notes (Signed)
[facility] CONSULT NOTE  Patient Care Team: Patient, No Pcp Per (Inactive) as PCP - General (General Practice) Runell Gess, MD as Consulting Physician (Cardiology)  CHIEF COMPLAINTS/PURPOSE OF CONSULTATION:  Mild eosinophilia, recent mild leukopenia  HISTORY OF PRESENTING ILLNESS:  Zachary Lara 60 y.o. male is here because of abnormal CBC He was seen in 2015 He is billed as a new patient today He is being referred here because of recent findings of abnormal CBC He was found to have white count of 3.9, 12.4% of eosinophil count was seen in the differential with absolute eosinophil count of 0.5.  My previous hematologic history from 2015: He was found to have abnormal CBC from 2012 with eosinophil percentage ranging from 8% to 21% He denies recent infection. The last prescription antibiotics was more than 3 months ago. He has chronic allergic rhinitis at least twice a year during the spring and the fall and recurrent dermatitis affecting his anterior shins. There is not reported symptoms of cough, diarrhea, joint swelling/pain and abnormal weight loss.  The patient had history of pericarditis and underwent pericardectomy. He had history of mild cardiac congestion causing abnormal changes in his liver function and had liver biopsy which was abnormal. He does not follow with a gastroenterologist.  He had no prior history or diagnosis of cancer. His age appropriate screening programs are up-to-date. The patient has no prior diagnosis of autoimmune disease and was not prescribed corticosteroids related products. He had skin allergy testing performed 4 years ago by his allergist and was told he is allergic to grass and trees. He has a dog at home but no other pets Most recently, he was told he is allergic to peppers and he avoided all food with peppers He had history of Schatzki's ring causing dysphagia and recently underwent esophageal dilatation.  According to him, he  has significant postop bleeding He denies recent infection.  He has chronic dermatitis affecting the shin area on both legs He drinks alcohol occasionally maybe once a week The patient denies any recent signs or symptoms of bleeding such as spontaneous epistaxis, hematuria or hematochezia.  MEDICAL HISTORY:  Past Medical History:  Diagnosis Date   Arthritis    Dizziness    SINCE TAKING FLUID PILL   Granulomatous hepatitis    H/O hiatal hernia    Hemorrhoids    Pericarditis    H/O, chronic calcific,normal stress cardiolite  10/20/2003 EF 52%   Pulmonary edema    CURRENTLY HAS ABNORMAL CXR - FOLLOWED BY CARDIOLOGIST AT [facility]   Shortness of breath    WITH EXERTION -    Ulcer    NON-BLEEDING STOMACH ULCER   Ventricular fibrillation (HCC)    POST OP PARICARDECTOMY - NO EPISODES SINCE    SURGICAL HISTORY: Past Surgical History:  Procedure Laterality Date   BAND HEMORRHOIDECTOMY     EXCISIONAL HEMORRHOIDECTOMY     LIVER BIOPSY     LUNG SURGERY  2013   pericardial stripping  08/19/11   [facility]   TONSILLECTOMY      SOCIAL HISTORY: Social History   Socioeconomic History   Marital status: Married    Spouse name: [name]   Number of children: Not on file   Years of education: 16   Highest education level: Not on file  Occupational History   Occupation: [employer]  Tobacco Use   Smoking status: Never   Smokeless tobacco: Never  Substance and Sexual Activity   Alcohol use: No  Drug use: No   Sexual activity: Yes    Partners: Female  Other Topics Concern   Not on file  Social History Narrative   Lives with wife   Caffeine use: 2 cups coffee every morning   Social Determinants of Health   Financial Resource Strain: Not on file  Food Insecurity: Not on file  Transportation Needs: Not on file  Physical Activity: Not on file  Stress: Not on file  Social Connections: Not on file  Intimate Partner Violence: Not on file    FAMILY HISTORY: Family History   Problem Relation Age of Onset   Heart attack Mother        multiple   Stroke Neg Hx     ALLERGIES:  is allergic to bio-flax, cottonseed oil, and other.  MEDICATIONS:  Current Outpatient Medications  Medication Sig Dispense Refill   acyclovir (ZOVIRAX) 400 MG tablet Take 400 mg by mouth.     fish oil-omega-3 fatty acids 1000 MG capsule Take 2 g by mouth daily.     Multiple Vitamin (MULITIVITAMIN WITH MINERALS) TABS Take 1 tablet by mouth daily with breakfast.     omeprazole (PRILOSEC) 40 MG capsule Take 40 mg by mouth 2 (two) times daily.  2   No current facility-administered medications for this visit.   Facility-Administered Medications Ordered in Other Visits  Medication Dose Route Frequency Provider Last Rate Last Admin   gadopentetate dimeglumine (MAGNEVIST) injection 17 mL  17 mL Intravenous Once PRN Anson Fret, MD        REVIEW OF SYSTEMS:   Constitutional: Denies fevers, chills or abnormal night sweats Eyes: Denies blurriness of vision, double vision or watery eyes Ears, nose, mouth, throat, and face: Denies mucositis or sore throat Respiratory: Denies cough, dyspnea or wheezes Cardiovascular: Denies palpitation, chest discomfort or lower extremity swelling Gastrointestinal:  Denies nausea, heartburn or change in bowel habits Lymphatics: Denies new lymphadenopathy or easy bruising Neurological:Denies numbness, tingling or new weaknesses Behavioral/Psych: Mood is stable, no new changes  All other systems were reviewed with the patient and are negative.  PHYSICAL EXAMINATION: ECOG PERFORMANCE STATUS: 0 - Asymptomatic  Vitals:   03/25/21 1358  BP: 116/71  Pulse: 80  Resp: 20  Temp: (!) 97.3 F (36.3 C)  SpO2: 100%   Filed Weights   03/25/21 1358  Weight: 163 lb 6.4 oz (74.1 kg)    GENERAL:alert, no distress and comfortable SKIN: skin color, texture, turgor are normal, no rashes or significant lesions EYES: normal, conjunctiva are pink and  non-injected, sclera clear OROPHARYNX:no exudate, no erythema and lips, buccal mucosa, and tongue normal  NECK: supple, thyroid normal size, non-tender, without nodularity LYMPH:  no palpable lymphadenopathy in the cervical, axillary or inguinal LUNGS: clear to auscultation and percussion with normal breathing effort HEART: regular rate & rhythm and no murmurs and no lower extremity edema ABDOMEN:abdomen soft, non-tender and normal bowel sounds Musculoskeletal:no cyanosis of digits and no clubbing  PSYCH: alert & oriented x 3 with fluent speech NEURO: no focal motor/sensory deficits  LABORATORY DATA:  I have reviewed the data as listed Recent Results (from the past 2160 hour(s))  CBC with Differential/Platelet     Status: Abnormal   Collection Time: 03/25/21  2:23 PM  Result Value Ref Range   WBC 4.3 4.0 - 10.5 K/uL   RBC 4.52 4.22 - 5.81 MIL/uL   Hemoglobin 14.9 13.0 - 17.0 g/dL   HCT 08.6 76.1 - 95.0 %   MCV 92.3 80.0 - 100.0  fL   MCH 33.0 26.0 - 34.0 pg   MCHC 35.7 30.0 - 36.0 g/dL   RDW 32.9 92.4 - 26.8 %   Platelets 141 (L) 150 - 400 K/uL   nRBC 0.0 0.0 - 0.2 %   Neutrophils Relative % 45 %   Neutro Abs 1.9 1.7 - 7.7 K/uL   Lymphocytes Relative 30 %   Lymphs Abs 1.3 0.7 - 4.0 K/uL   Monocytes Relative 10 %   Monocytes Absolute 0.5 0.1 - 1.0 K/uL   Eosinophils Relative 15 %   Eosinophils Absolute 0.6 (H) 0.0 - 0.5 K/uL   Basophils Relative 0 %   Basophils Absolute 0.0 0.0 - 0.1 K/uL   Immature Granulocytes 0 %   Abs Immature Granulocytes 0.01 0.00 - 0.07 K/uL    Comment: Performed at [facility], 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 The patient had confirmed allergic reaction to grass and trees from prior skin test performed by his allergist. 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 He does not fulfill the criteria for myeloproliferative disorder and his absolute eosinophil count has never been over 1.5. I reassured the patient. I recommend over-the-counter allergy medicine such as Pepcid that might help with reducing risk of reflux or esophageal damage I do not feel strongly we need to order accessible work to exclude myeloproliferative disorder. I reassured him and his wife.   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 and recently was found to be allergic to peppers Overall, his clinical presentation is most consistent with atopy rather than drug allergies I recommend trial of over-the-counter Pepcid  Thrombocytopenia (HCC) He has mild thrombocytopenia likely due to recent consumption from bleeding from his GI procedure I told him alcohol intake can also affect platelet count We discussed the risk and benefits of close surveillance He would like to return here in a month for further follow-up with repeat CBC   Orders Placed This Encounter  Procedures   CBC with Differential/Platelet    Standing Status:   Standing    Number of Occurrences:   22    Standing Expiration Date:   03/25/2022    All questions were answered. The patient knows to call the clinic with any problems, questions or concerns. I spent 55 minutes counseling the patient face to face.     Artis Delay, MD 03/25/2021 2:45 PM

## 2021-04-21 ENCOUNTER — Encounter: Payer: Self-pay | Admitting: Hematology and Oncology

## 2021-04-29 ENCOUNTER — Inpatient Hospital Stay: Payer: Managed Care, Other (non HMO) | Admitting: Hematology and Oncology

## 2021-04-29 ENCOUNTER — Other Ambulatory Visit: Payer: Self-pay

## 2021-04-29 ENCOUNTER — Inpatient Hospital Stay: Payer: Managed Care, Other (non HMO) | Attending: Hematology and Oncology

## 2021-04-29 DIAGNOSIS — D696 Thrombocytopenia, unspecified: Secondary | ICD-10-CM | POA: Diagnosis not present

## 2021-04-29 DIAGNOSIS — F1090 Alcohol use, unspecified, uncomplicated: Secondary | ICD-10-CM | POA: Diagnosis not present

## 2021-04-29 DIAGNOSIS — R799 Abnormal finding of blood chemistry, unspecified: Secondary | ICD-10-CM | POA: Diagnosis not present

## 2021-04-29 DIAGNOSIS — D7219 Other eosinophilia: Secondary | ICD-10-CM

## 2021-04-29 LAB — CBC WITH DIFFERENTIAL/PLATELET
Abs Immature Granulocytes: 0.03 10*3/uL (ref 0.00–0.07)
Basophils Absolute: 0 10*3/uL (ref 0.0–0.1)
Basophils Relative: 1 %
Eosinophils Absolute: 0.4 10*3/uL (ref 0.0–0.5)
Eosinophils Relative: 8 %
HCT: 42.3 % (ref 39.0–52.0)
Hemoglobin: 15.1 g/dL (ref 13.0–17.0)
Immature Granulocytes: 1 %
Lymphocytes Relative: 28 %
Lymphs Abs: 1.4 10*3/uL (ref 0.7–4.0)
MCH: 32.9 pg (ref 26.0–34.0)
MCHC: 35.7 g/dL (ref 30.0–36.0)
MCV: 92.2 fL (ref 80.0–100.0)
Monocytes Absolute: 0.4 10*3/uL (ref 0.1–1.0)
Monocytes Relative: 9 %
Neutro Abs: 2.6 10*3/uL (ref 1.7–7.7)
Neutrophils Relative %: 53 %
Platelets: 167 10*3/uL (ref 150–400)
RBC: 4.59 MIL/uL (ref 4.22–5.81)
RDW: 12.7 % (ref 11.5–15.5)
WBC: 4.8 10*3/uL (ref 4.0–10.5)
nRBC: 0 % (ref 0.0–0.2)

## 2021-04-29 NOTE — Assessment & Plan Note (Signed)
His thrombocytopenia has resolved, could be due to alcohol intake Eosinophil count is normal His CBC is completely normal He is reassured and he does not need return appointment

## 2021-04-29 NOTE — Progress Notes (Signed)
[facility] OFFICE PROGRESS NOTE  Patient, No Pcp Per (Inactive)  ASSESSMENT & PLAN:  Thrombocytopenia (HCC) His thrombocytopenia has resolved, could be due to alcohol intake Eosinophil count is normal His CBC is completely normal He is reassured and he does not need return appointment  No orders of the defined types were placed in this encounter.   The total time spent in the appointment was 10 minutes encounter with patients including review of chart and various tests results, discussions about plan of care and coordination of care plan   All questions were answered. The patient knows to call the clinic with any problems, questions or concerns. No barriers to learning was detected.    Artis Delay, MD 10/20/20224:44 PM  INTERVAL HISTORY: Zachary Lara 60 y.o. male returns for follow-up regarding abnormal CBC He is doing well No new issues since last time I saw him  SUMMARY OF HEMATOLOGIC HISTORY: Zachary Lara 60 y.o. male is here because of abnormal CBC He was seen in 2015 He is billed as a new patient today He is being referred here because of recent findings of abnormal CBC He was found to have white count of 3.9, 12.4% of eosinophil count was seen in the differential with absolute eosinophil count of 0.5.  My previous hematologic history from 2015: He was found to have abnormal CBC from 2012 with eosinophil percentage ranging from 8% to 21% He denies recent infection. The last prescription antibiotics was more than 3 months ago. He has chronic allergic rhinitis at least twice a year during the spring and the fall and recurrent dermatitis affecting his anterior shins. There is not reported symptoms of cough, diarrhea, joint swelling/pain and abnormal weight loss.  The patient had history of pericarditis and underwent pericardectomy. He had history of mild cardiac congestion causing abnormal changes in his liver function and had liver biopsy which was  abnormal. He does not follow with a gastroenterologist.  He had no prior history or diagnosis of cancer. His age appropriate screening programs are up-to-date. The patient has no prior diagnosis of autoimmune disease and was not prescribed corticosteroids related products. He had skin allergy testing performed 4 years ago by his allergist and was told he is allergic to grass and trees. He has a dog at home but no other pets Most recently, he was told he is allergic to peppers and he avoided all food with peppers He had history of Schatzki's ring causing dysphagia and recently underwent esophageal dilatation.  According to him, he has significant postop bleeding He denies recent infection.  He has chronic dermatitis affecting the shin area on both legs He drinks alcohol occasionally maybe once a week The patient denies any recent signs or symptoms of bleeding such as spontaneous epistaxis, hematuria or hematochezia.   I have reviewed the past medical history, past surgical history, social history and family history with the patient and they are unchanged from previous note.  ALLERGIES:  is allergic to bio-flax, cottonseed oil, and other.  MEDICATIONS:  Current Outpatient Medications  Medication Sig Dispense Refill   acyclovir (ZOVIRAX) 400 MG tablet Take 400 mg by mouth.     fish oil-omega-3 fatty acids 1000 MG capsule Take 2 g by mouth daily.     Multiple Vitamin (MULITIVITAMIN WITH MINERALS) TABS Take 1 tablet by mouth daily with breakfast.     omeprazole (PRILOSEC) 40 MG capsule Take 40 mg by mouth 2 (two) times daily.  2   No  current facility-administered medications for this visit.   Facility-Administered Medications Ordered in Other Visits  Medication Dose Route Frequency Provider Last Rate Last Admin   gadopentetate dimeglumine (MAGNEVIST) injection 17 mL  17 mL Intravenous Once PRN Anson Fret, MD         REVIEW OF SYSTEMS:   Constitutional: Denies fevers, chills or night  sweats Eyes: Denies blurriness of vision Ears, nose, mouth, throat, and face: Denies mucositis or sore throat Respiratory: Denies cough, dyspnea or wheezes Cardiovascular: Denies palpitation, chest discomfort or lower extremity swelling Gastrointestinal:  Denies nausea, heartburn or change in bowel habits Skin: Denies abnormal skin rashes Lymphatics: Denies new lymphadenopathy or easy bruising Neurological:Denies numbness, tingling or new weaknesses Behavioral/Psych: Mood is stable, no new changes  All other systems were reviewed with the patient and are negative.  PHYSICAL EXAMINATION: ECOG PERFORMANCE STATUS: 0 - Asymptomatic  Vitals:   04/29/21 1125  BP: 117/73  Pulse: 65  Resp: 18  Temp: 98.5 F (36.9 C)  SpO2: 99%   Filed Weights   04/29/21 1125  Weight: 164 lb (74.4 kg)    GENERAL:alert, no distress and comfortable NEURO: alert & oriented x 3 with fluent speech, no focal motor/sensory deficits  LABORATORY DATA:  I have reviewed the data as listed     Component Value Date/Time   NA 142 08/27/2015 0837   K 4.3 08/27/2015 0837   CL 99 08/27/2015 0837   CO2 25 08/27/2015 0837   GLUCOSE 89 08/27/2015 0837   GLUCOSE 93 10/17/2011 0950   BUN 21 08/27/2015 0837   CREATININE 0.95 08/27/2015 0837   CREATININE 1.04 09/28/2010 1742   CALCIUM 9.3 08/27/2015 0837   PROT 7.0 09/01/2020 1018   ALBUMIN 4.5 09/01/2020 1018   AST 22 09/01/2020 1018   ALT 24 09/01/2020 1018   ALKPHOS 82 09/01/2020 1018   BILITOT 0.7 09/01/2020 1018   GFRNONAA 88 08/27/2015 0837   GFRNONAA >60 09/28/2010 1742   GFRAA 102 08/27/2015 0837   GFRAA >60 09/28/2010 1742    No results found for: SPEP, UPEP  Lab Results  Component Value Date   WBC 4.8 04/29/2021   NEUTROABS 2.6 04/29/2021   HGB 15.1 04/29/2021   HCT 42.3 04/29/2021   MCV 92.2 04/29/2021   PLT 167 04/29/2021      Chemistry      Component Value Date/Time   NA 142 08/27/2015 0837   K 4.3 08/27/2015 0837   CL 99  08/27/2015 0837   CO2 25 08/27/2015 0837   BUN 21 08/27/2015 0837   CREATININE 0.95 08/27/2015 0837   CREATININE 1.04 09/28/2010 1742      Component Value Date/Time   CALCIUM 9.3 08/27/2015 0837   ALKPHOS 82 09/01/2020 1018   AST 22 09/01/2020 1018   ALT 24 09/01/2020 1018   BILITOT 0.7 09/01/2020 1018

## 2021-05-28 ENCOUNTER — Ambulatory Visit (HOSPITAL_COMMUNITY): Payer: Managed Care, Other (non HMO) | Attending: Internal Medicine

## 2021-05-28 ENCOUNTER — Other Ambulatory Visit: Payer: Self-pay

## 2021-05-28 DIAGNOSIS — I311 Chronic constrictive pericarditis: Secondary | ICD-10-CM

## 2021-05-28 LAB — ECHOCARDIOGRAM COMPLETE
Area-P 1/2: 2.86 cm2
S' Lateral: 3.1 cm

## 2021-05-29 ENCOUNTER — Encounter: Payer: Self-pay | Admitting: Cardiovascular Disease

## 2021-06-28 ENCOUNTER — Encounter: Payer: Self-pay | Admitting: Cardiovascular Disease

## 2021-07-22 ENCOUNTER — Other Ambulatory Visit: Payer: Self-pay

## 2021-07-22 DIAGNOSIS — G4733 Obstructive sleep apnea (adult) (pediatric): Secondary | ICD-10-CM

## 2021-09-30 ENCOUNTER — Other Ambulatory Visit: Payer: Self-pay

## 2021-09-30 ENCOUNTER — Ambulatory Visit (HOSPITAL_BASED_OUTPATIENT_CLINIC_OR_DEPARTMENT_OTHER): Payer: Managed Care, Other (non HMO) | Attending: Internal Medicine | Admitting: Internal Medicine

## 2021-09-30 VITALS — Ht 70.0 in | Wt 165.0 lb

## 2021-09-30 DIAGNOSIS — G4733 Obstructive sleep apnea (adult) (pediatric): Secondary | ICD-10-CM | POA: Diagnosis present

## 2021-09-30 DIAGNOSIS — G4719 Other hypersomnia: Secondary | ICD-10-CM

## 2021-09-30 DIAGNOSIS — G478 Other sleep disorders: Secondary | ICD-10-CM | POA: Diagnosis not present

## 2021-09-30 DIAGNOSIS — R0683 Snoring: Secondary | ICD-10-CM | POA: Insufficient documentation

## 2021-10-07 NOTE — Procedures (Signed)
? ?  NAME: Zachary Lara ?DATE OF BIRTH:  1960/12/18 ?MEDICAL RECORD NUMBER [id number]  ?LOCATION: [facility]  ?PHYSICIAN: Deretha Emory  ?DATE OF STUDY: 09/30/2021 ? ?SLEEP STUDY TYPE: Nocturnal Polysomnogram ?              ?REFERRING PHYSICIAN: Deretha Emory, MD ? ?EPWORTH SLEEPINESS SCORE:   ?HEIGHT: 5\' 10"  (177.8 cm)  ?WEIGHT: 165 lb (74.8 kg)    Body mass index is 23.68 kg/m?.  ?NECK SIZE: 14 in. ? ?CLINICAL INFORMATION ?The patient was referred to the sleep center for evaluation sleep apnea. He has a history of positional OSA. Recent HSAT suggested sleep apnea but duration was not adequate for formal diagnosis. He has snoring, witnessed apnea, excessive daytime sleepiness ? ?MEDICATIONS ?Patient self administered medications: no sleep medicine administered.. ? ?SLEEP STUDY TECHNIQUE ?A multi-channel overnight Polysomnography study was performed. The channels recorded and monitored were central and occipital EEG, electrooculogram (EOG), submentalis EMG (chin), nasal and oral airflow, thoracic and abdominal wall motion, anterior tibialis EMG, snore microphone, electrocardiogram, and a pulse oximetry. ? ?TECHNICAL COMMENTS ?Comments added by Technician: None ?Comments added by Scorer: N/A ? ?SLEEP ARCHITECTURE ?The study was initiated at 9:56:00 PM and terminated at 4:30:04 AM. The total recorded time was 394.1 minutes. EEG confirmed total sleep time was 331.8 minutes yielding a sleep efficiency of 84.2%. Sleep onset after lights out was 8.7 minutes with a REM latency of 62.0 minutes. The patient spent 10.1% of the night in stage N1 sleep, 65.0% in stage N2 sleep, 13.0% in stage N3 and 11.9% in REM. Wake after sleep onset (WASO) was 53.5 minutes. The Arousal Index was 27.7/hour. ? ?RESPIRATORY PARAMETERS ?There were a total of 3 respiratory disturbances out of which 2 were apneas (1 obstructive, 1 mixed, 0 central) and 1 hypopneas. The apnea/hypopnea index (AHI) was 0.5 events/hour.  The central sleep apnea index was 0 events/hour. The REM AHI was 0.0 events/hour and NREM AHI was 0.6 events/hour. The supine AHI was 2.5 events/hour and the non supine AHI was 0.4 events/hour. Patient was supine during 7.35% of sleep. Respiratory disturbance index was 2.5 events/hour overall and 32 events/hour supine. Respiratory disturbances were associated with oxygen desaturation down to a nadir of 92.0% during sleep. The mean oxygen saturation during the study was 95.0%. ? ?LEG MOVEMENT DATA ?The total leg movements were 109 with a resulting leg movement index of 19.7/hr . Associated arousal with leg movement index was 6.9/hr. ? ?CARDIAC DATA ?The underlying cardiac rhythm was most consistent with sinus rhythm. Mean heart rate during sleep was 66.6 bpm. Additional rhythm abnormalities include PVCs. ? ?IMPRESSIONS ?- No Significant Obstructive Sleep apnea (OSA) overall. 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 Degree of arousal from leg movements is probably not significant ? ?DIAGNOSIS ?- Positional sleep disordered breathing ? ?RECOMMENDATIONS ?- Avoid alcohol, sedatives and other CNS depressants that may worsen sleep apnea and disrupt normal sleep architecture. ?- Avoid supine sleep if possible.  ? ? ?Sleep specialist, American Board of Internal Medicine ? ?ELECTRONICALLY SIGNED ON:  10/07/2021, 11:48 AM ?[facility] ?PH: [phone] 10/09/2021   FX: [phone] ?ACCREDITED BY THE AMERICAN ACADEMY OF SLEEP MEDICINE ? ?

## 2022-01-07 ENCOUNTER — Telehealth: Payer: Self-pay | Admitting: Cardiovascular Disease

## 2022-01-07 NOTE — Telephone Encounter (Signed)
Patient returning call.

## 2022-01-07 NOTE — Telephone Encounter (Signed)
LMTCB

## 2022-01-07 NOTE — Telephone Encounter (Signed)
Pt's wife called stating she thinks pt has a myocarditis. Resting hr 85-90. She states that 3 weeks ago he collapsed. She states that he feels like his heart his racing all the time and that before these symptoms happened he used to workout all the time and now he is just tired all time. Pt is at work right now so she ask that you call her back.

## 2022-01-07 NOTE — Telephone Encounter (Signed)
Spoke to patient's wife.She stated husband has been having episodes of fast heart beat and fatigue since 11/2021.Appointment scheduled with Gillian Shields NP 01/13/22 at 8:25 am at [facility] location.

## 2022-01-07 NOTE — Telephone Encounter (Signed)
  Per MyChart scheduling message:  The main reason for a visit is, a little over a week ago, something happened, it felt like someone punched me really hard under my left arm on my side. My breathing was a little crazy, my heart felt like it was jumping in my chest, it took a while get moving again.  My resting heart rate is 90, doesn't take much to get it over 100. I am gaining weight around my waist, Sort of balloon like /puffy.

## 2022-01-07 NOTE — Telephone Encounter (Signed)
  Per MyChart scheduling message:  Could you ask Dr. Allyson Sabal, after read my notes, if it is alright to push mow my yard if I take my time to doing it?   My wife will not allow me to mow the yard.

## 2022-01-09 ENCOUNTER — Emergency Department (HOSPITAL_COMMUNITY)
Admission: EM | Admit: 2022-01-09 | Discharge: 2022-01-09 | Disposition: A | Discharge status: Home / Self Care | Payer: Managed Care, Other (non HMO) | Attending: Emergency Medicine | Admitting: Emergency Medicine

## 2022-01-09 ENCOUNTER — Emergency Department (HOSPITAL_COMMUNITY): Payer: Managed Care, Other (non HMO)

## 2022-01-09 ENCOUNTER — Encounter (HOSPITAL_COMMUNITY): Payer: Self-pay

## 2022-01-09 ENCOUNTER — Other Ambulatory Visit: Payer: Self-pay

## 2022-01-09 DIAGNOSIS — R079 Chest pain, unspecified: Secondary | ICD-10-CM | POA: Diagnosis present

## 2022-01-09 LAB — TROPONIN I (HIGH SENSITIVITY)
Troponin I (High Sensitivity): 4 ng/L (ref <18)
Troponin I (High Sensitivity): 4 ng/L (ref <18)

## 2022-01-09 LAB — CBC
HCT: 46.5 % (ref 39.0–52.0)
Hemoglobin: 15.8 g/dL (ref 13.0–17.0)
MCH: 32 pg (ref 26.0–34.0)
MCHC: 34 g/dL (ref 30.0–36.0)
MCV: 94.3 fL (ref 80.0–100.0)
Platelets: 136 10*3/uL — ABNORMAL LOW (ref 150–400)
RBC: 4.93 MIL/uL (ref 4.22–5.81)
RDW: 13.2 % (ref 11.5–15.5)
WBC: 5 10*3/uL (ref 4.0–10.5)
nRBC: 0 % (ref 0.0–0.2)

## 2022-01-09 LAB — BASIC METABOLIC PANEL
Anion gap: 8 (ref 5–15)
BUN: 16 mg/dL (ref 8–23)
CO2: 25 mmol/L (ref 22–32)
Calcium: 8.8 mg/dL — ABNORMAL LOW (ref 8.9–10.3)
Chloride: 107 mmol/L (ref 98–111)
Creatinine, Ser: 0.95 mg/dL (ref 0.61–1.24)
GFR, Estimated: >60 mL/min (ref >60)
Glucose, Bld: 112 mg/dL — ABNORMAL HIGH (ref 70–99)
Potassium: 3.5 mmol/L (ref 3.5–5.1)
Sodium: 140 mmol/L (ref 135–145)

## 2022-01-09 MED ORDER — IOHEXOL 350 MG/ML SOLN
75.0000 mL | Freq: Once | INTRAVENOUS | Status: AC | PRN
  Administered 2022-01-09: 75 mL via INTRAVENOUS

## 2022-01-09 MED ORDER — PANTOPRAZOLE SODIUM 40 MG PO TBEC
40.0000 mg | DELAYED_RELEASE_TABLET | Freq: Every day | ORAL | Status: DC
  Administered 2022-01-09: 40 mg via ORAL
  Filled 2022-01-09: qty 1

## 2022-01-09 NOTE — ED Triage Notes (Signed)
Patient c/o chest pain x 1 week. Patient states he has had episodes where his heart rate has dropped to around 50 and the last being this AM. Patient states he began feeling weird and that is why he checked it. Patient denies any SOB, nausea, diaphoresis.

## 2022-01-09 NOTE — Discharge Instructions (Signed)
The testing today does not show any serious problems.  There is no sign of pericarditis, blood clot or heart attack.  Your weakness and fatigue, may be related to the recent episode of allergic reaction/Stevens Johnson syndrome.  Get plenty of rest and drink a lot of fluids.  See your doctor as needed for problems.

## 2022-01-09 NOTE — ED Provider Notes (Signed)
[facility]-EMERGENCY DEPT Provider Note   CSN: [id number] Arrival date & time: 01/09/22  0800     History  Chief Complaint  Patient presents with   Chest Pain    Zachary Lara is a 61 y.o. male.  HPI Patient presents today for evaluation of a period of shaking, high blood pressure and low heart rate.  This occurred spontaneously today.  In the last few days he has had a nonproductive cough.  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  He apparently had generalized papule rash that was red.  He has a single residual lesion of the right ankle which is almost healed.  He has been concerned over the last week because of some puffiness of his abdomen.  He feels generally fatigued, over the last several weeks.  SJS was diagnosed about 2 and half weeks ago in [place].  He was treated with a round of prednisone which she has completed.  He has been eating well.  He did not take his morning medicines today.  He has not eaten yet today.    Home Medications Prior to Admission medications   Medication Sig Start Date End Date Taking? Authorizing Provider  acyclovir (ZOVIRAX) 400 MG tablet Take 400 mg by mouth. 08/25/15   [provider]  fish oil-omega-3 fatty acids 1000 MG capsule Take 2 g by mouth daily.    [provider]  Multiple Vitamin (MULITIVITAMIN WITH MINERALS) TABS Take 1 tablet by mouth daily with breakfast.    [provider]  omeprazole (PRILOSEC) 40 MG capsule Take 40 mg by mouth 2 (two) times daily. 12/30/15   [provider]      Allergies    Cottonseed oil, Flax seed oil, and Other    Review of Systems   Review of Systems  Physical Exam Updated Vital Signs BP 118/75   Pulse 82   Temp 98.3 F (36.8 C) (Oral)   Resp 13   Ht 5\' 10"  (1.778 m)   Wt 74.8 kg   SpO2 97%   BMI 23.68 kg/m  Physical Exam Vitals and nursing note reviewed.   Constitutional:      General: He is not in acute distress.    Appearance: He is well-developed. He is not ill-appearing, toxic-appearing or diaphoretic.  HENT:     Head: Normocephalic and atraumatic.     Right Ear: External ear normal.     Left Ear: External ear normal.     Nose: No congestion.  Eyes:     Conjunctiva/sclera: Conjunctivae normal.     Pupils: Pupils are equal, round, and reactive to light.  Neck:     Trachea: Phonation normal.  Cardiovascular:     Rate and Rhythm: Normal rate and regular rhythm.     Heart sounds: Normal heart sounds. No murmur heard.    No friction rub.  Pulmonary:     Effort: Pulmonary effort is normal. No respiratory distress.     Breath sounds: Normal breath sounds. No stridor.  Chest:     Chest wall: No tenderness.  Abdominal:     General: There is no distension.     Palpations: Abdomen is soft. There is no mass.     Tenderness: There is no abdominal tenderness. There is no guarding.     Hernia: No hernia is present.  Musculoskeletal:        General: Normal range of motion.  Cervical back: Normal range of motion and neck supple.  Skin:    General: Skin is warm and dry.     Comments: Wound, right medial ankle, about 4 mm, and appears to be a healing carbuncle.  No surrounding erythema, no bleeding or drainage.  Neurological:     Mental Status: He is alert and oriented to person, place, and time.     Cranial Nerves: No cranial nerve deficit.     Sensory: No sensory deficit.     Motor: No abnormal muscle tone.     Coordination: Coordination normal.  Psychiatric:        Mood and Affect: Mood normal.        Behavior: Behavior normal.        Thought Content: Thought content normal.        Judgment: Judgment normal.     ED Results / Procedures / Treatments   Labs (all labs ordered are listed, but only abnormal results are displayed) Labs Reviewed  BASIC METABOLIC PANEL - Abnormal; Notable for the following components:      Result  Value   Glucose, Bld 112 (*)    Calcium 8.8 (*)    All other components within normal limits  CBC - Abnormal; Notable for the following components:   Platelets 136 (*)    All other components within normal limits  TROPONIN I (HIGH SENSITIVITY)  TROPONIN I (HIGH SENSITIVITY)    EKG EKG Interpretation  Date/Time:  Sunday January 09 2022 08:07:46 EDT Ventricular Rate:  82 PR Interval:  201 QRS Duration: 95 QT Interval:  359 QTC Calculation: 420 R Axis:   87 Text Interpretation: Sinus rhythm Ventricular premature complex Aberrant conduction of SV complex(es) Probable anterior infarct, old Borderline T abnormalities, inferior leads No old tracing to compare Confirmed by Mancel Bale [phone]) on 01/09/2022 2:46:16 PM  Radiology CT Angio Chest PE W/Cm &/Or Wo Cm  Result Date: 01/09/2022 CLINICAL DATA:  Pulmonary embolism (PE) suspected, high prob EXAM: CT ANGIOGRAPHY CHEST WITH CONTRAST TECHNIQUE: Multidetector CT imaging of the chest was performed using the standard protocol during bolus administration of intravenous contrast. Multiplanar CT image reconstructions and MIPs were obtained to evaluate the vascular anatomy. RADIATION DOSE REDUCTION: This exam was performed according to the departmental dose-optimization program which includes automated exposure control, adjustment of the mA and/or kV according to patient size and/or use of iterative reconstruction technique. CONTRAST:  46mL OMNIPAQUE IOHEXOL 350 MG/ML SOLN COMPARISON:  CT dated March 29, 2016. October 28, 2006 FINDINGS: Cardiovascular: Satisfactory opacification of the pulmonary arteries to the segmental level with mildly limited evaluation secondary to respiratory motion. No evidence of pulmonary embolism. Mildly enlarged heart size. No pericardial effusion. Coarse calcifications of the pericardium, similar in comparison to 2017 and overall decreased since 2008. They extend and mainly along the posterior aspect of the LEFT ventricle  Mediastinum/Nodes: Visualized thyroid is unremarkable. No axillary or mediastinal adenopathy. Lungs/Pleura: No pleural effusion or pneumothorax. Similar appearance of biapical scarring. Mild LEFT basilar atelectasis. Upper Abdomen: No acute abnormality. Musculoskeletal: Status post median sternotomy. No acute osseous abnormality. Review of the MIP images confirms the above findings. IMPRESSION: 1. No evidence of acute pulmonary embolism. 2. Revisualization of coarse calcifications of the pericardium. These are overall decreased since 2008 and are stable dating back to at least 2017. Electronically Signed   By: Meda Klinefelter M.D.   On: 01/09/2022 17:52   DG Chest 2 View  Result Date: 01/09/2022 CLINICAL DATA:  61 year old male with chest  pain for 1 week. Bradycardia. EXAM: CHEST - 2 VIEW COMPARISON:  Chest CT 10/28/2006 and earlier. FINDINGS: Sternotomy since 2008. Increased chronic thoracolumbar scoliosis. Lung volumes and mediastinal contours remain within normal limits. Pericardial calcification appears regressed or resolved since 2008. There is chronic biapical lung scarring. No pneumothorax, pulmonary edema, pleural effusion, or confluent pulmonary opacity identified. No acute osseous abnormality identified. Negative visible bowel gas. IMPRESSION: No acute cardiopulmonary abnormality. Regression of pericardial calcifications since 2008. Electronically Signed   By: Odessa Fleming M.D.   On: 01/09/2022 08:58    Procedures Procedures    Medications Ordered in ED Medications  iohexol (OMNIPAQUE) 350 MG/ML injection 75 mL (75 mLs Intravenous Contrast Given 01/09/22 1728)    ED Course/ Medical Decision Making/ A&P                           Medical Decision Making Patient presenting for reported elevation of blood pressure and decreased heart rate, and mild chest pressure sensation.  He has a history of pericarditis requiring pericardial window, several decades ago.  He gets ongoing surveillance for  that but has not had recurrence.  He recently had Stevens-Johnson syndrome after using Septra to treat an abscess of his leg.  He did not have any oral involvement by a generalized rash after that.  He is presenting today with nonspecific symptoms.  With history of pericarditis will evaluate for recurrence with CT of the chest and also evaluate for PE causing his constellation of symptoms.  I have a low suspicion for PE.  EKG does not indicate ischemia and first troponin is negative.  Will get second troponin to ensure no changes.  I anticipate he can be discharged later.  Amount and/or Complexity of Data Reviewed Independent Historian:     Details: He is a cogent historian Labs: ordered.    Details: CBC, metabolic panel, troponin-normal findings Radiology: ordered.    Details: Chest x-ray, CT angiogram chest to evaluate for PE and pericarditis.  These tests were reassuring and there are no acute abnormalities.  Risk Prescription drug management. Decision regarding hospitalization. Risk Details: Patient presenting with multiple complaints following recent bout of Stevens-Johnson syndrome related to Septra use.  He was concerned that he had recurrence of pericarditis.  There is no finding for same.  Doubt ACS, current active SJS, metabolic disorders or acute lung problems such as PE or pneumonia.  He is stable for discharge with symptomatic treatment.  He does not require hospitalization.           Final Clinical Impression(s) / ED Diagnoses Final diagnoses:  Nonspecific chest pain    Rx / DC Orders ED Discharge Orders     None         Mancel Bale, MD 01/10/22 1622

## 2022-01-09 NOTE — ED Notes (Signed)
Pt d/c home with wife per MD order. Discharge summary reviewed, verbalize understanding. Ambulatory . No s/s of acute distress noted at discharge.

## 2022-01-09 NOTE — ED Provider Triage Note (Signed)
Emergency Medicine Provider Triage Evaluation Note  Zachary Lara , a 61 y.o. male  was evaluated in triage.  Pt complains of chest pain off and on for the past 1.5 weeks. The patient reports that occasionally it feels like someone punched him in his left chest. Denies any SOB. Mention that his dystolic BP has been fluctuating between 80-95. Patient reports that his resting HR is usually 65 and it ahs been 90 for hte past 1.5 weeks.   Review of Systems  Positive:  Negative:   Physical Exam  BP (!) 143/85 (BP Location: Left Arm)   Pulse 83   Temp 98 F (36.7 C) (Oral)   Resp 16   Ht 5\' 10"  (1.778 m)   Wt 74.8 kg   SpO2 99%   BMI 23.68 kg/m  Gen:   Awake, no distress   Resp:  Normal effort  MSK:   Moves extremities without difficulty  Other:  Occasional PVCs  Medical Decision Making  Medically screening exam initiated at 8:17 AM.  Appropriate orders placed.  was informed that the remainder of the evaluation will be completed by another provider, this initial triage assessment does not replace that evaluation, and the importance of remaining in the ED until their evaluation is complete.  Cardiac labs ordered. Patient has a h/o pericarditis with paricardectomy and v fib.    Zachary Lara, Achille Rich 01/09/22 [phone]

## 2022-01-12 NOTE — Progress Notes (Unsigned)
Office Visit    Patient Name: Zachary Lara Date of Encounter: 01/13/2022  PCP:  Patient, No Pcp Per   [facility]  Cardiologist:  Nanetta Batty, MD  Advanced Practice Provider:  No care team member to display Electrophysiologist:  None      Chief Complaint    Zachary Lara is a 61 y.o. male presents today for ED follow up   Past Medical History    Past Medical History:  Diagnosis Date   Arthritis    Dizziness    SINCE TAKING FLUID PILL   Granulomatous hepatitis    H/O hiatal hernia    Hemorrhoids    Pericarditis    H/O, chronic calcific,normal stress cardiolite  10/20/2003 EF 52%   Pulmonary edema    CURRENTLY HAS ABNORMAL CXR - FOLLOWED BY CARDIOLOGIST AT [facility]   Shortness of breath    WITH EXERTION -    Ulcer    NON-BLEEDING STOMACH ULCER   Ventricular fibrillation (HCC)    POST OP PARICARDECTOMY - NO EPISODES SINCE   Past Surgical History:  Procedure Laterality Date   BAND HEMORRHOIDECTOMY     EXCISIONAL HEMORRHOIDECTOMY     LIVER BIOPSY     LUNG SURGERY  2013   pericardial stripping  08/19/11   [facility]   TONSILLECTOMY      Allergies  Allergies  Allergen Reactions   Cottonseed Oil Hives   Flax Seed Oil Hives   Other     Dark chocolate    Sulfa Antibiotics     Other reaction(s): shortness of breath; Stevens-Johnson Syndrome    History of Present Illness    Zachary Lara is a 61 y.o. male with a hx of pericarditis s/p radical pericardiectomy at [facility] in [place] 2013 with subsequent VATS and pleurodesis, GERD last seen 09/01/20. Family history notable for coronary disease.   Previously followed at [facility] in [place]. 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 Soon after had VATS with pleurodesis because of recurrent pleural effusion. CT chest [facility] 06/2020 unremarkable. Echo EF 40-45%, normal valvular function. Established with Dr. Allyson Sabal 09/01/20. Echo 05/28/21 normal  LVEF 55-60%, no RWMA, normal diastolic parameters, of thre efindings for constrictive physiology - only one noted, no significant valvular abnormalities.   ED visit 01/09/22 with shaking, high blood pressure, low heart rate. 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 EKG NSR with PVC's.  CXR, CT chest reassuring. BP 118/75 and HR 82. HS troponin 4 ? 4. CBC Hb15.8, Plt 136. BMP creat 0.96, K 3.5, Ca 8.8.   Presents today for follow up with his wife. He works out three times per week with mix of weights and aerobics. Endorses following a heart healthy diet.   Tells me heart rate and blood pressure have been labile. Notes palpitations. BP as high as 140s/90s and heart rate from 50s-90s. Wonders if heart rat was getting too low. Tachycardia and fatigue since 11/2021. However, somewhat improved since SJS has improved. Notes he will feel some chest pressure when his diastolic blood pressure is up.   EKGs/Labs/Other Studies Reviewed:   The following studies were reviewed today:  Echo 05/2021 1. Left ventricular ejection fraction, by estimation, is 55 to 60%. The  left ventricle has normal function. The left ventricle has no regional  wall  motion abnormalities. Left ventricular diastolic parameters were  normal. There is an interventricular  septal bounce without an annulus reversus. No IVC plethora, IVC spectral  Doppler not performed for hepatic diastolic flow reversal ratio. Of the  three findings assessed for constrictive physiology, only one is seen.   2. Right ventricular systolic function is normal. The right ventricular  size is normal. Tricuspid regurgitation signal is inadequate for assessing  PA pressure.   3. The mitral valve is normal in structure. No evidence of mitral valve  regurgitation. No evidence of mitral stenosis.    4. The aortic valve is tricuspid. Aortic valve regurgitation is not  visualized. No aortic stenosis is present.   5. The inferior vena cava is normal in size with greater than 50%  respiratory variability, suggesting right atrial pressure of 3 mmHg.   Comparison(s): Unable to access 2008 study or report.   EKG:  No EKG today.   Recent Labs: 01/09/2022: BUN 16; Creatinine, Ser 0.95; Hemoglobin 15.8; Platelets 136; Potassium 3.5; Sodium 140  Recent Lipid Panel    Component Value Date/Time   CHOL 121 09/01/2020 1018   TRIG 89 09/01/2020 1018   HDL 42 09/01/2020 1018   CHOLHDL 2.9 09/01/2020 1018   LDLCALC 62 09/01/2020 1018     Home Medications   Current Meds  Medication Sig   acyclovir (ZOVIRAX) 400 MG tablet Take 400 mg by mouth.   Ascorbic Acid (VITAMIN C PO) Take 1 tablet by mouth daily.   Cholecalciferol (VITAMIN D3 ADULT GUMMIES) 25 MCG (1000 UT) CHEW 1 tablet   fish oil-omega-3 fatty acids 1000 MG capsule Take 2 g by mouth daily.   magnesium oxide (MAG-OX) 400 MG tablet 1 tablet every other day   Multiple Vitamin (MULITIVITAMIN WITH MINERALS) TABS Take 1 tablet by mouth daily with breakfast.   omeprazole (PRILOSEC) 40 MG capsule Take 40 mg by mouth 2 (two) times daily.    Review of Systems      All other systems reviewed and are otherwise negative except as noted above.  Physical Exam    VS:  BP 104/70   Pulse 68   Ht 5\' 10"  (1.778 m)   Wt 166 lb (75.3 kg)   BMI 23.82 kg/m  , BMI Body mass index is 23.82 kg/m.  Wt Readings from Last 3 Encounters:  01/13/22 166 lb (75.3 kg)  01/09/22 165 lb (74.8 kg)  09/30/21 165 lb (74.8 kg)    GEN: Well nourished, well developed, in no acute distress. HEENT: normal. Neck: Supple, no JVD, carotid bruits, or masses. Cardiac: RRR, no murmurs, rubs, or gallops. No clubbing, cyanosis, edema.  Radials/PT 2+ and equal bilaterally.  Respiratory:  Respirations regular and unlabored, clear to auscultation bilaterally. GI: Soft,  nontender, nondistended. MS: No deformity or atrophy. Skin: Warm and dry, no rash. Neuro:  Strength and sensation are intact. Psych: Normal affect.  Assessment & Plan    Fatigue / PVC / tachycardia - 2 month history of fatigue in setting of SJS. Also notes tachycardia, labile heart rate.  Echo November 2022 no significant valve normalities.  14-day ZIO placed in clinic.  Recent CBC, BMP unremarkable.  TSH, magnesium today.  If significant PVCs may need to consider beta-blocker.  Anticipate heart rate reading falsely low on his home monitor due to PVCs.  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   Disposition: Follow up  2 mos  with 2014, MD or APP.  Signed, Nanetta Batty  Gilford Rile, NP 01/13/2022, 8:39 AM [facility]

## 2022-01-13 ENCOUNTER — Ambulatory Visit (HOSPITAL_BASED_OUTPATIENT_CLINIC_OR_DEPARTMENT_OTHER): Payer: Managed Care, Other (non HMO)

## 2022-01-13 ENCOUNTER — Ambulatory Visit (HOSPITAL_BASED_OUTPATIENT_CLINIC_OR_DEPARTMENT_OTHER): Payer: Managed Care, Other (non HMO) | Admitting: Family

## 2022-01-13 ENCOUNTER — Encounter (HOSPITAL_BASED_OUTPATIENT_CLINIC_OR_DEPARTMENT_OTHER): Payer: Self-pay | Admitting: Family

## 2022-01-13 VITALS — BP 104/70 | HR 68 | Ht 70.0 in | Wt 166.0 lb

## 2022-01-13 DIAGNOSIS — R000 Tachycardia, unspecified: Secondary | ICD-10-CM | POA: Diagnosis not present

## 2022-01-13 DIAGNOSIS — R5383 Other fatigue: Secondary | ICD-10-CM

## 2022-01-13 DIAGNOSIS — I493 Ventricular premature depolarization: Secondary | ICD-10-CM

## 2022-01-13 NOTE — Patient Instructions (Addendum)
Medication Instructions:  Continue your current medications  *If you need a refill on your cardiac medications before your next appointment, please call your pharmacy*   Lab Work: Your physician recommends that you return for lab work today: thyroid panel, magnesium   If you have labs (blood work) drawn today and your tests are completely normal, you will receive your results only by: MyChart Message (if you have MyChart) OR A paper copy in the mail If you have any lab test that is abnormal or we need to change your treatment, we will call you to review the results.   Testing/Procedures: Your physician has recommended that you wear a Zio monitor.   This monitor is a medical device that records the heart's electrical activity. Doctors most often use these monitors to diagnose arrhythmias. Arrhythmias are problems with the speed or rhythm of the heartbeat. The monitor is a small device applied to your chest. You can wear one while you do your normal daily activities. While wearing this monitor if you have any symptoms to push the button and record what you felt. Once you have worn this monitor for the period of time provider prescribed (Usually 14 days), you will return the monitor device in the postage paid box. Once it is returned they will download the data collected and provide [place] with a report which the provider will then review and we will call you with those results. Important tips:  Avoid showering during the first 24 hours of wearing the monitor. Avoid excessive sweating to help maximize wear time. Do not submerge the device, no hot tubs, and no swimming pools. Keep any lotions or oils away from the patch. After 24 hours you may shower with the patch on. Take brief showers with your back facing the shower head.  Do not remove patch once it has been placed because that will interrupt data and decrease adhesive wear time. Push the button when you have any symptoms and write down what  you were feeling. Once you have completed wearing your monitor, remove and place into box which has postage paid and place in your outgoing mailbox.  If for some reason you have misplaced your box then call our office and we can provide another box and/or mail it off for you.  Follow-Up: At [facility], you and your health needs are our priority.  As part of our continuing mission to provide you with exceptional heart care, we have created designated Provider Care Teams.  These Care Teams include your primary Cardiologist (physician) and Advanced Practice Providers (APPs -  Physician Assistants and Nurse Practitioners) who all work together to provide you with the care you need, when you need it.  We recommend signing up for the patient portal called "MyChart".  Sign up information is provided on this After Visit Summary.  MyChart is used to connect with patients for Virtual Visits (Telemedicine).  Patients are able to view lab/test results, encounter notes, upcoming appointments, etc.  Non-urgent messages can be sent to your provider as well.   To learn more about what you can do with MyChart, go to [email or web address].    Your next appointment:   2 month(s)  The format for your next appointment:   In Person  Provider:   Nanetta Batty, MD or Alver Sorrow, NP     Other Instructions  Heart Healthy Diet Recommendations: A low-salt diet is recommended. Meats should be grilled, baked, or boiled. Avoid fried foods. Focus on lean protein sources  like fish or chicken with vegetables and fruits. The American Heart Association is a Chief Technology Officer!  American Heart Association Diet and Lifeystyle Recommendations   Exercise recommendations: The American Heart Association recommends 150 minutes of moderate intensity exercise weekly. Try 30 minutes of moderate intensity exercise 4-5 times per week. This could include walking, jogging, or swimming.  Recommend establishing with a primary  care provider.  You may call [facility] @ [phone] for a list of primary care providers in your area or visit their website [email or web address] Please have any insurance card available before calling or going online.    Premature Ventricular Contraction  A premature ventricular contraction (PVC) is a common kind of irregular heartbeat (arrhythmia). These contractions are extra heartbeats that start in the ventricles of the heart and occur too early in the normal sequence. During the PVC, the heart's normal electrical pathway is not used, so the beat is shorter and less effective. In most cases, these contractions come and go and do not require treatment. What are the causes? Common causes of the condition include: Smoking. Drinking alcohol. Certain medicines. Some illegal drugs. Stress. Caffeine. Certain medical conditions can also cause PVCs: Heart failure. Heart attack, or coronary artery disease. Heart valve problems. Changes in minerals in the blood (electrolytes). Low blood oxygen levels or high carbon dioxide levels. In many cases, the cause of this condition is not known. What are the signs or symptoms? The main symptom of this condition is fast or skipped heartbeats (palpitations). Other symptoms include: Chest pain. Shortness of breath. Feeling tired. Dizziness. Difficulty exercising. In some cases, there are no symptoms. How is this diagnosed? This condition may be diagnosed based on: Your medical history. A physical exam. During the exam, the health care provider will check for irregular heartbeats. Tests, such as: An ECG (electrocardiogram) to monitor the electrical activity of your heart. An ambulatory cardiac monitor. This device records your heartbeats for 24 hours or more. Stress tests to see how exercise affects your heart rhythm and blood supply. An echocardiogram. This test uses sound waves (ultrasound) to produce an image of your  heart. An electrophysiology study (EPS). This test checks for electrical problems in your heart. How is this treated? Treatment for this condition depends on any underlying conditions, the type of PVCs that you are having, and how much the symptoms are interfering with your daily life. Possible treatments include: Avoiding things that cause premature contractions (triggers). These include caffeine and alcohol. Taking medicines if symptoms are severe or if the extra heartbeats are frequent. Getting treatment for underlying conditions that cause PVCs. Having an implantable cardioverter defibrillator (ICD), if you are at risk for a serious arrhythmia. The ICD is a small device that is inserted into your chest to monitor your heartbeat. When it senses an irregular heartbeat, it sends a shock to bring the heartbeat back to normal. Having a procedure to destroy the portion of the heart tissue that sends out abnormal signals (catheter ablation). In some cases, no treatment is required. Follow these instructions at home: Lifestyle Do not use any products that contain nicotine or tobacco, such as cigarettes, e-cigarettes, and chewing tobacco. If you need help quitting, ask your health care provider. Do not use illegal drugs. Exercise regularly. Ask your health care provider what type of exercise is safe for you. Try to get at least 7-9 hours of sleep each night, or as much as recommended by your health care provider. Find healthy ways to manage stress. Avoid stressful  situations when possible. Alcohol use Do not drink alcohol if: Your health care provider tells you not to drink. You are pregnant, may be pregnant, or are planning to become pregnant. Alcohol triggers your episodes. If you drink alcohol: Limit how much you use to: 0-1 drink a day for women. 0-2 drinks a day for men. Be aware of how much alcohol is in your drink. In the [place] one drink equals one 12 oz bottle of beer (355 mL), one 5 oz  glass of wine (148 mL), or one 1 oz glass of hard liquor (44 mL). General instructions Take over-the-counter and prescription medicines only as told by your health care provider. If caffeine triggers episodes of PVC, do not eat, drink, or use anything with caffeine in it. Keep all follow-up visits as told by your health care provider. This is important. Contact a health care provider if you: Feel palpitations. Get help right away if you: Have chest pain. Have shortness of breath. Have sweating for no reason. Have nausea and vomiting. Become light-headed or you faint. Summary A premature ventricular contraction (PVC) is a common kind of irregular heartbeat (arrhythmia). In most cases, these contractions come and go and do not require treatment. You may need to wear an ambulatory cardiac monitor. This records your heartbeats for 24 hours or more. Treatment depends on any underlying conditions, the type of PVCs that you are having, and how much the symptoms are interfering with your daily life. This information is not intended to replace advice given to you by your health care provider. Make sure you discuss any questions you have with your health care provider. Document Revised: 11/24/2020 Document Reviewed: 11/24/2020 Elsevier Patient Education  [place].

## 2022-01-14 ENCOUNTER — Telehealth (HOSPITAL_BASED_OUTPATIENT_CLINIC_OR_DEPARTMENT_OTHER): Payer: Self-pay

## 2022-01-14 LAB — THYROID PANEL WITH TSH
Free Thyroxine Index: 2.5 (ref 1.2–4.9)
T3 Uptake Ratio: 25 % (ref 24–39)
T4, Total: 9.9 ug/dL (ref 4.5–12.0)
TSH: 2.38 u[IU]/mL (ref 0.450–4.500)

## 2022-01-14 LAB — MAGNESIUM: Magnesium: 2.4 mg/dL — ABNORMAL HIGH (ref 1.6–2.3)

## 2022-01-14 NOTE — Telephone Encounter (Addendum)
Results called to patient who verbalizes understanding!    ----- Message from Alver Sorrow, NP sent at 01/14/2022  7:54 AM EDT ----- Magnesium mildly elevated at 2.4.  Not of concern.  Would continue his magnesium supplementation at current dose.  Normal thyroid function.

## 2022-01-19 ENCOUNTER — Encounter: Payer: Self-pay | Admitting: Cardiovascular Disease

## 2022-01-19 ENCOUNTER — Ambulatory Visit: Payer: Managed Care, Other (non HMO) | Admitting: Cardiovascular Disease

## 2022-01-19 DIAGNOSIS — I311 Chronic constrictive pericarditis: Secondary | ICD-10-CM

## 2022-01-19 NOTE — Patient Instructions (Signed)
Medication Instructions:  Your physician recommends that you continue on your current medications as directed. Please refer to the Current Medication list given to you today.  *If you need a refill on your cardiac medications before your next appointment, please call your pharmacy*   Follow-Up: At [facility], you and your health needs are our priority.  As part of our continuing mission to provide you with exceptional heart care, we have created designated Provider Care Teams.  These Care Teams include your primary Cardiologist (physician) and Advanced Practice Providers (APPs -  Physician Assistants and Nurse Practitioners) who all work together to provide you with the care you need, when you need it.  We recommend signing up for the patient portal called "MyChart".  Sign up information is provided on this After Visit Summary.  MyChart is used to connect with patients for Virtual Visits (Telemedicine).  Patients are able to view lab/test results, encounter notes, upcoming appointments, etc.  Non-urgent messages can be sent to your provider as well.   To learn more about what you can do with MyChart, go to [email or web address].    Your next appointment:   6 month(s)  The format for your next appointment:   In Person  Provider:   Caitlin Walker, NP      Then, Jonathan Berry, MD will plan to see you again in 12 month(s). 

## 2022-01-19 NOTE — Progress Notes (Signed)
01/19/2022 Zachary Lara   Mar 01, 1961  [id number]  Primary Physician Patient, No Pcp Per Primary Cardiologist: Zachary Gess MD Zachary Lara  HPI:  Zachary Lara is a 61 y.o.  thin and fit appearing married Caucasian male father of 3 (2 stepchildren), grandfather 3 grandchildren who is accompanied by his wife in Zachary Lara today.  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  He works in Consulting civil engineer.  He drinks socially.  He has had 2 brothers that you have had stents.  He is never had a heart attack or stroke.  He denies chest pain or shortness of breath.  He exercises vigorously without limitation.  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  He also had a VATS procedure and what sounds like pleurodesis subsequent to that.  He has been followed at [facility] by Dr. Hyacinth Lara every other year and is here to be established in my practice.  He apparently had infection in his leg and was treated with Septra and doxycycline.  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  He was treated with steroids.  He then had labile hypertension and tachycardia.  All the symptoms over have since resolved.  He saw Zachary Lara in the office 01/13/2022 who ordered a 2-week Zio patch which she is currently wearing.  He is very active and exercises hard 3 days a week.     Current Meds  Medication Sig   acyclovir (ZOVIRAX) 400 MG tablet Take 400 mg by mouth.   Ascorbic Acid (VITAMIN C PO) Take 1 tablet by mouth daily.   Cholecalciferol (VITAMIN D3 ADULT GUMMIES) 25 MCG (1000 UT) CHEW 1 tablet   fish oil-omega-3 fatty acids 1000 MG capsule Take 2 g by mouth daily.   magnesium oxide (MAG-OX) 400 MG tablet  1 tablet every other day   Multiple Vitamin (MULITIVITAMIN WITH MINERALS) TABS Take 1 tablet by mouth daily with breakfast.   omeprazole (PRILOSEC) 40 MG capsule Take 40 mg by mouth 2 (two) times daily.     Allergies  Allergen Reactions   Cottonseed Oil Hives   Flax Seed Oil Hives   Other     Dark chocolate    Sulfa Antibiotics     Other reaction(s): shortness of breath; Stevens-Johnson Syndrome    Social History   Socioeconomic History   Marital status: Married    Spouse name: Zachary Lara   Number of children: Not on file   Years of education: 16   Highest education level: Not on file  Occupational History   Occupation: Civil Service fast streamer  Tobacco Use   Smoking status: Never   Smokeless tobacco: Never  Building services engineer Use: Never used  Substance and Sexual Activity   Alcohol use: Not Currently   Drug use: No   Sexual activity: Yes    Partners: Female  Other Topics Concern   Not on file  Social History Narrative   Lives with wife   Caffeine use: 2 cups coffee every morning   Social Determinants of Health   Financial Resource Strain: Not on file  Food Insecurity: Not on file  Transportation Needs: Not on file  Physical Activity: Not on file  Stress:  Not on file  Social Connections: Not on file  Intimate Partner Violence: Not on file     Review of Systems: General: negative for chills, fever, night sweats or weight changes.  Cardiovascular: negative for chest pain, dyspnea on exertion, edema, orthopnea, palpitations, paroxysmal nocturnal dyspnea or shortness of breath Dermatological: negative for rash Respiratory: negative for cough or wheezing Urologic: negative for hematuria Abdominal: negative for nausea, vomiting, diarrhea, bright red blood per rectum, melena, or hematemesis Neurologic: negative for visual changes, syncope, or dizziness All other systems reviewed and are otherwise negative except as noted above.    Blood pressure 112/70, pulse 68,  height 5\' 10"  (1.778 m), weight 165 lb (74.8 kg).  General appearance: alert and no distress Neck: no adenopathy, no carotid bruit, no JVD, supple, symmetrical, trachea midline, and thyroid not enlarged, symmetric, no tenderness/mass/nodules Lungs: clear to auscultation bilaterally Heart: regular rate and rhythm, S1, S2 normal, no murmur, click, rub or gallop Extremities: extremities normal, atraumatic, no cyanosis or edema Pulses: 2+ and symmetric Skin: Skin color, texture, turgor normal. 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  2D echo performed 05/28/2021 was essentially normal without signs of restriction.     05/30/2021 MD FACP,FACC,FAHA, [facility] 01/19/2022 1:29 PM

## 2022-01-19 NOTE — Assessment & Plan Note (Signed)
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  2D echo performed 05/28/2021 was essentially normal without signs of restriction.

## 2022-01-27 ENCOUNTER — Encounter (HOSPITAL_BASED_OUTPATIENT_CLINIC_OR_DEPARTMENT_OTHER): Payer: Self-pay

## 2022-02-08 ENCOUNTER — Telehealth (HOSPITAL_BASED_OUTPATIENT_CLINIC_OR_DEPARTMENT_OTHER): Payer: Self-pay

## 2022-02-08 MED ORDER — METOPROLOL SUCCINATE ER 25 MG PO TB24: 25.0000 mg | ORAL_TABLET | Freq: Every day | ORAL | 11 refills | Status: DC

## 2022-02-08 NOTE — Telephone Encounter (Addendum)
Called results to patient and left results on VM (ok per DPR), instructions left to call office back to schedule follow up appointment, Rx to pharmacy on file.      ----- Message from Alver Sorrow, NP sent at 02/08/2022  1:21 PM EDT ----- Monitor with predominantly normal sinus rhythm. Did show episodes of a fast heart beat in the top chamber of the heart as well as early beats in the bottom chambers of the heart. Recommend starting Metoprolol Succinate 25mg  daily with clinic visit 2-3 weeks after starting to assess response.

## 2022-02-08 NOTE — Telephone Encounter (Signed)
Please advise 

## 2022-02-08 NOTE — Telephone Encounter (Signed)
May continue to monitor for symtpoms of palpitations, lightheadedness, or dizziness until follow up visit in September.   Prevent palpitations by staying well hydrated, managing stress well, avoiding caffeine.   Alver Sorrow, NP

## 2022-02-09 NOTE — Telephone Encounter (Signed)
Please advise on how to respond. 

## 2022-03-15 NOTE — Progress Notes (Unsigned)
Office Visit    Patient Name: Zachary Lara Date of Encounter: 03/16/2022  PCP:  Patient, No Pcp Per   [facility]  Cardiologist:  Quay Burow, MD  Advanced Practice Provider:  No care team member to display Electrophysiologist:  None      Chief Complaint    Zachary Lara is a 61 y.o. male presents today for follow-up after monitor  Past Medical History    Past Medical History:  Diagnosis Date   Arthritis    Dizziness    SINCE TAKING FLUID PILL   Granulomatous hepatitis    H/O hiatal hernia    Hemorrhoids    Pericarditis    H/O, chronic calcific,normal stress cardiolite  10/20/2003 EF 52%   Pulmonary edema    CURRENTLY HAS ABNORMAL CXR - FOLLOWED BY CARDIOLOGIST AT [facility]   Shortness of breath    WITH EXERTION -    Ulcer    NON-BLEEDING STOMACH ULCER   Ventricular fibrillation ([place])    POST OP PARICARDECTOMY - NO EPISODES SINCE   Past Surgical History:  Procedure Laterality Date   BAND HEMORRHOIDECTOMY     EXCISIONAL HEMORRHOIDECTOMY     LIVER BIOPSY     LUNG SURGERY  2013   pericardial stripping  08/19/11   [facility]   TONSILLECTOMY      Allergies  Allergies  Allergen Reactions   Cottonseed Oil Hives   Flax Seed Oil Hives   Other     Dark chocolate    Sulfa Antibiotics     Other reaction(s): shortness of breath; Stevens-Johnson Syndrome    History of Present Illness    Zachary Lara is a 61 y.o. male with a hx of pericarditis s/p radical pericardiectomy at [facility] in [place] 2013 with subsequent VATS and pleurodesis, GERD last seen 01/19/22. Family history notable for coronary disease.   Previously followed at [facility] in [place]. 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 Soon after had VATS with pleurodesis because of recurrent pleural effusion. CT chest [facility] 06/2020 unremarkable. Echo EF 40-45%, normal valvular function. Established with Dr. Gwenlyn Found 09/01/20. Echo  05/28/21 normal LVEF 55-60%, no RWMA, normal diastolic parameters, of thre efindings for constrictive physiology - only one noted, no significant valvular abnormalities.   ED visit 01/09/22 with shaking, high blood pressure, low heart rate. Noted to be recovering from "[employer] syndrome" following treatment of left leg abscess with Septra and doxycycline with generalized papule rash diagnosed in [place] 2 weeks prior treated with prednisone. EKG NSR with PVC's.  CXR, CT chest reassuring. BP 118/75 and HR 82. HS troponin 4 ? 4. CBC Hb15.8, Plt 136. BMP creat 0.96, K 3.5, Ca 8.8.   Seen 01/13/22 in follow up with labile BP and heart rate. ZIO placed which revealed predominantly NSR but did have 44 runs of SVT as well as PVCs. Triggered episodes associated with ST and PAC/PVC. Recommended for Toprol 25mg  daily.    Presents today for follow up independently with his wife on the phone.  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 Endorses following a heart healthy diet.  Since adding metoprolol notes feeling fatigued.  He moved it to bedtime and still feels tiredness if he could take a nap.  Has had previous sleep study which was unremarkable per his report.  Reports no recent palpitations, tachycardia.  EKGs/Labs/Other Studies Reviewed:  The following studies were reviewed today:  ZIO 02/04/22 Patient had a min HR of 54 bpm, max HR of 160 bpm, and avg HR of 83 bpm. Predominant underlying rhythm was Sinus Rhythm. 44 Supraventricular Tachycardia runs occurred, the run with the fastest interval lasting 7 beats with a max rate of 160 bpm, the  longest lasting 36.2 secs with an avg rate of 120 bpm. Isolated SVEs were rare (<1.0%), SVE Couplets were rare (<1.0%), and SVE Triplets were rare (<1.0%). Isolated VEs were rare (<1.0%, 12567), VE Couplets were rare (<1.0%, 121), and VE Triplets were  rare (<1.0%, 11). Ventricular Bigeminy and Trigeminy  were present.    SR/SB/ST Freq PVCs Runs of SVT Needs ROV to discuss  Echo 05/2021 1. Left ventricular ejection fraction, by estimation, is 55 to 60%. The  left ventricle has normal function. The left ventricle has no regional  wall motion abnormalities. Left ventricular diastolic parameters were  normal. There is an interventricular  septal bounce without an annulus reversus. No IVC plethora, IVC spectral  Doppler not performed for hepatic diastolic flow reversal ratio. Of the  three findings assessed for constrictive physiology, only one is seen.   2. Right ventricular systolic function is normal. The right ventricular  size is normal. Tricuspid regurgitation signal is inadequate for assessing  PA pressure.   3. The mitral valve is normal in structure. No evidence of mitral valve  regurgitation. No evidence of mitral stenosis.   4. The aortic valve is tricuspid. Aortic valve regurgitation is not  visualized. No aortic stenosis is present.   5. The inferior vena cava is normal in size with greater than 50%  respiratory variability, suggesting right atrial pressure of 3 mmHg.   Comparison(s): Unable to access 2008 study or report.   EKG:  No EKG today.   Recent Labs: 01/09/2022: BUN 16; Creatinine, Ser 0.95; Hemoglobin 15.8; Platelets 136; Potassium 3.5; Sodium 140 01/13/2022: Magnesium 2.4; TSH 2.380  Recent Lipid Panel    Component Value Date/Time   CHOL 121 09/01/2020 1018   TRIG 89 09/01/2020 1018   HDL 42 09/01/2020 1018   CHOLHDL 2.9 09/01/2020 1018   LDLCALC 62 09/01/2020 1018     Home Medications   Current Meds  Medication Sig   acyclovir (ZOVIRAX) 400 MG tablet Take 400 mg by mouth.   Ascorbic Acid (VITAMIN C PO) Take 1 tablet by mouth daily.   Cholecalciferol (VITAMIN D3) 25 MCG (1000 UT) CAPS Take 1 capsule by mouth daily.   fish oil-omega-3 fatty acids 1000 MG capsule Take 2 g by mouth daily.   magnesium oxide (MAG-OX) 400 MG tablet 1 tablet every other day    omeprazole (PRILOSEC) 40 MG capsule Take 40 mg by mouth 2 (two) times daily.   [DISCONTINUED] metoprolol succinate (TOPROL-XL) 25 MG 24 hr tablet Take 1 tablet (25 mg total) by mouth daily. Take with or immediately following a meal.    Review of Systems      All other systems reviewed and are otherwise negative except as noted above.  Physical Exam    VS:  BP 112/64   Pulse 71   Ht 5\' 10"  (1.778 m)   Wt 172 lb (78 kg)   BMI 24.68 kg/m  , BMI Body mass index is 24.68 kg/m.  Wt Readings from Last 3 Encounters:  03/16/22 172 lb (78 kg)  01/19/22 165 lb (74.8 kg)  01/13/22 166 lb (75.3 kg)    GEN: Well nourished, well developed, in no  acute distress. HEENT: normal. Neck: Supple, no JVD, carotid bruits, or masses. Cardiac: RRR, no murmurs, rubs, or gallops. No clubbing, cyanosis, edema.  Radials/PT 2+ and equal bilaterally.  Respiratory:  Respirations regular and unlabored, clear to auscultation bilaterally. GI: Soft, nontender, nondistended. MS: No deformity or atrophy. Skin: Warm and dry, no rash. Neuro:  Strength and sensation are intact. Psych: Normal affect.  Assessment & Plan    SVT/ PVC  -prior palpitations in setting of SJS.  Monitor with 44 runs of SVT fastest 7 beats rate 160 bpm, longest 36.2 seconds 120 bpm.  Echo November 2022 no significant valve normalities.  Did not tolerate metoprolol succinate well with fatigue.  Will discontinue Toprol.  If fatigue does not improve, further follow up with PCP. We discussed possibly taking alternate medication such as diltiazem as needed for breakthrough palpitations but he understandably prefers to avoid medications.  Encouraged to continue to avoid excessive caffeine, alcohol, manage stress well. He will contact our office for new or worsening palpitations at which time CCB could be considered.   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   Disposition: Follow up in 6 month(s) with  Nanetta Batty, MD or APP.  Signed, Alver Sorrow, NP 03/16/2022, 9:55 AM [facility]

## 2022-03-16 ENCOUNTER — Encounter (HOSPITAL_BASED_OUTPATIENT_CLINIC_OR_DEPARTMENT_OTHER): Payer: Self-pay | Admitting: Family

## 2022-03-16 ENCOUNTER — Ambulatory Visit (HOSPITAL_BASED_OUTPATIENT_CLINIC_OR_DEPARTMENT_OTHER): Payer: Managed Care, Other (non HMO) | Admitting: Family

## 2022-03-16 VITALS — BP 112/64 | HR 71 | Ht 70.0 in | Wt 172.0 lb

## 2022-03-16 DIAGNOSIS — I311 Chronic constrictive pericarditis: Secondary | ICD-10-CM

## 2022-03-16 DIAGNOSIS — I493 Ventricular premature depolarization: Secondary | ICD-10-CM | POA: Diagnosis not present

## 2022-03-16 DIAGNOSIS — I471 Supraventricular tachycardia: Secondary | ICD-10-CM

## 2022-03-16 MED ORDER — METOPROLOL SUCCINATE ER 25 MG PO TB24: 12.5000 mg | ORAL_TABLET | ORAL | 11 refills | Status: DC | PRN

## 2022-03-16 NOTE — Patient Instructions (Addendum)
Medication Instructions:  Your physician has recommended you make the following change in your medication:  Stop: Metoprolol- You may take a half tablet as needed for palpitations  *If you need a refill on your cardiac medications before your next appointment, please call your pharmacy*  Follow-Up: At [facility], you and your health needs are our priority.  As part of our continuing mission to provide you with exceptional heart care, we have created designated Provider Care Teams.  These Care Teams include your primary Cardiologist (physician) and Advanced Practice Providers (APPs -  Physician Assistants and Nurse Practitioners) who all work together to provide you with the care you need, when you need it.  We recommend signing up for the patient portal called "MyChart".  Sign up information is provided on this After Visit Summary.  MyChart is used to connect with patients for Virtual Visits (Telemedicine).  Patients are able to view lab/test results, encounter notes, upcoming appointments, etc.  Non-urgent messages can be sent to your provider as well.   To learn more about what you can do with MyChart, go to [email or web address].    Your next appointment:   6 month(s)  The format for your next appointment:   In Person  Provider:   Gillian Shields, NP or Dr. Allyson Sabal     Other Instructions Heart Healthy Diet Recommendations: A low-salt diet is recommended. Meats should be grilled, baked, or boiled. Avoid fried foods. Focus on lean protein sources like fish or chicken with vegetables and fruits. The American Heart Association is a Chief Technology Officer!  American Heart Association Diet and Lifeystyle Recommendations   Exercise recommendations: The American Heart Association recommends 150 minutes of moderate intensity exercise weekly. Try 30 minutes of moderate intensity exercise 4-5 times per week. This could include walking, jogging, or swimming.   Important Information About  Sugar

## 2022-04-01 ENCOUNTER — Encounter (HOSPITAL_BASED_OUTPATIENT_CLINIC_OR_DEPARTMENT_OTHER): Payer: Self-pay

## 2022-06-22 ENCOUNTER — Encounter (HOSPITAL_BASED_OUTPATIENT_CLINIC_OR_DEPARTMENT_OTHER): Payer: Self-pay

## 2022-06-22 DIAGNOSIS — I493 Ventricular premature depolarization: Secondary | ICD-10-CM

## 2022-06-22 DIAGNOSIS — I471 Supraventricular tachycardia, unspecified: Secondary | ICD-10-CM

## 2022-06-23 NOTE — Telephone Encounter (Signed)
More information to come

## 2022-06-24 MED ORDER — DILTIAZEM HCL ER COATED BEADS 120 MG PO CP24: 120.0000 mg | ORAL_CAPSULE | Freq: Every day | ORAL | 2 refills | Status: DC

## 2022-06-24 NOTE — Telephone Encounter (Signed)
Pt. Wants to try ER version, which dose would you like me to send.

## 2022-07-19 DIAGNOSIS — L2089 Other atopic dermatitis: Secondary | ICD-10-CM | POA: Diagnosis not present

## 2022-07-22 ENCOUNTER — Other Ambulatory Visit (HOSPITAL_COMMUNITY): Payer: Self-pay

## 2022-07-22 ENCOUNTER — Other Ambulatory Visit: Payer: Self-pay

## 2022-07-22 ENCOUNTER — Encounter (HOSPITAL_COMMUNITY): Payer: Self-pay

## 2022-07-22 ENCOUNTER — Encounter (HOSPITAL_BASED_OUTPATIENT_CLINIC_OR_DEPARTMENT_OTHER): Payer: Self-pay | Admitting: Family

## 2022-07-22 ENCOUNTER — Ambulatory Visit (HOSPITAL_BASED_OUTPATIENT_CLINIC_OR_DEPARTMENT_OTHER): Payer: BC Managed Care – PPO | Admitting: Family

## 2022-07-22 VITALS — BP 114/76 | HR 77 | Ht 70.0 in | Wt 169.0 lb

## 2022-07-22 DIAGNOSIS — I493 Ventricular premature depolarization: Secondary | ICD-10-CM | POA: Diagnosis not present

## 2022-07-22 DIAGNOSIS — R079 Chest pain, unspecified: Secondary | ICD-10-CM

## 2022-07-22 DIAGNOSIS — I471 Supraventricular tachycardia, unspecified: Secondary | ICD-10-CM

## 2022-07-22 DIAGNOSIS — R072 Precordial pain: Secondary | ICD-10-CM

## 2022-07-22 MED ORDER — IVABRADINE HCL 5 MG PO TABS
ORAL_TABLET | ORAL | 0 refills | Status: DC
  Filled 2022-07-22: qty 2, 1d supply, fill #0

## 2022-07-22 NOTE — Patient Instructions (Addendum)
Medication Instructions:  Continue your current medications.  *If you need a refill on your cardiac medications before your next appointment, please call your pharmacy*   Lab Work: Your physician recommends that you return for lab work: BMP, sed rate, CRP, CBC  If you have labs (blood work) drawn today and your tests are completely normal, you will receive your results only by: [place] (if you have MyChart) OR A paper copy in the mail If you have any lab test that is abnormal or we need to change your treatment, we will call you to review the results.   Testing/Procedures: Your EKG today showed normal sinus rhythm which is a good result.   Follow-Up: At [facility], you and your health needs are our priority.  As part of our continuing mission to provide you with exceptional heart care, we have created designated Provider Care Teams.  These Care Teams include your primary Cardiologist (physician) and Advanced Practice Providers (APPs -  Physician Assistants and Nurse Practitioners) who all work together to provide you with the care you need, when you need it.  We recommend signing up for the patient portal called "MyChart".  Sign up information is provided on this After Visit Summary.  MyChart is used to connect with patients for Virtual Visits (Telemedicine).  Patients are able to view lab/test results, encounter notes, upcoming appointments, etc.  Non-urgent messages can be sent to your provider as well.   To learn more about what you can do with MyChart, go to [email or web address].    Your next appointment:   2-3 month(s)  Provider:   Quay Burow, MD  or Loel Dubonnet, NP    Other Instructions    Your cardiac CT will be scheduled at one of the below locations:   [facility] [address] [place], [place] [address] [phone]  If scheduled at [facility], please arrive at the [facility] and Children's Entrance (Entrance C2) of  [facility] 30 minutes prior to test start time. You can use the FREE valet parking offered at entrance C (encouraged to control the heart rate for the test)  Proceed to the [facility] Radiology Department (first floor) to check-in and test prep.  All radiology patients and guests should use entrance C2 at [facility], accessed from [facility], even though the hospital's physical address listed is [address].       Please follow these instructions carefully (unless otherwise directed):  Hold all erectile dysfunction medications at least 3 days (72 hrs) prior to test. (Ie viagra, cialis, sildenafil, tadalafil, etc) We will administer nitroglycerin during this exam.   On the Night Before the Test: Be sure to Drink plenty of water. Do not consume any caffeinated/decaffeinated beverages or chocolate 12 hours prior to your test. Do not take any antihistamines 12 hours prior to your test.  On the Day of the Test: Drink plenty of water until 1 hour prior to the test. Do not eat any food 1 hour prior to test. You may take your regular medications prior to the test.  Take Ivabradine (2 tablets) 10mg  two hours prior to test       After the Test: Drink plenty of water. After receiving IV contrast, you may experience a mild flushed feeling. This is normal. On occasion, you may experience a mild rash up to 24 hours after the test. This is not dangerous. If this occurs, you can take Benadryl 25  mg and increase your fluid intake. If you experience trouble breathing, this can be serious. If it is severe call 911 IMMEDIATELY. If it is mild, please call our office. If you take any of these medications: Glipizide/Metformin, Avandament, Glucavance, please do not take 48 hours after completing test unless otherwise instructed.  We will call to schedule your test 2-4 weeks out understanding that some insurance companies will need an authorization prior to the  service being performed.   For non-scheduling related questions, please contact the cardiac imaging nurse navigator should you have any questions/concerns: Marchia Bond, Cardiac Imaging Nurse Navigator Gordy Clement, Cardiac Imaging Nurse Navigator [facility] Direct Office Dial: [phone]   For scheduling needs, including cancellations and rescheduling, please call [place], [phone].

## 2022-07-22 NOTE — Progress Notes (Unsigned)
Office Visit    Patient Name: Zachary Lara Date of Encounter: 07/22/2022  PCP:  Patient, No Pcp Per   [facility]  Cardiologist:  Quay Burow, MD  Advanced Practice Provider:  No care team member to display Electrophysiologist:  None      Chief Complaint    Zachary Lara is a 62 y.o. male presents today for follow-up ***  Past Medical History    Past Medical History:  Diagnosis Date   Arthritis    Dizziness    SINCE TAKING FLUID PILL   Granulomatous hepatitis    H/O hiatal hernia    Hemorrhoids    Pericarditis    H/O, chronic calcific,normal stress cardiolite  10/20/2003 EF 52%   Pulmonary edema    CURRENTLY HAS ABNORMAL CXR - FOLLOWED BY CARDIOLOGIST AT [facility]   PVC (premature ventricular contraction)    Shortness of breath    WITH EXERTION -    SVT (supraventricular tachycardia)    Ulcer    NON-BLEEDING STOMACH ULCER   Ventricular fibrillation ([place])    POST OP PARICARDECTOMY - NO EPISODES SINCE   Past Surgical History:  Procedure Laterality Date   BAND HEMORRHOIDECTOMY     EXCISIONAL HEMORRHOIDECTOMY     LIVER BIOPSY     LUNG SURGERY  2013   pericardial stripping  08/19/11   [facility]   TONSILLECTOMY      Allergies  Allergies  Allergen Reactions   Cottonseed Oil Hives   Flax Seed Oil Hives   Other     Dark chocolate    Sulfa Antibiotics     Other reaction(s): shortness of breath; Stevens-Johnson Syndrome    History of Present Illness    Zachary Lara is a 62 y.o. male with a hx of pericarditis s/p radical pericardiectomy at [facility] in [place] 2013 with subsequent VATS and pleurodesis, GERD last seen 03/16/22. Family history notable for coronary disease.   Previously followed at [facility] in [place]. 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 Soon after had VATS with pleurodesis because of recurrent pleural effusion. CT chest [facility] 06/2020 unremarkable. Echo EF  40-45%, normal valvular function. Established with Dr. Gwenlyn Found 09/01/20. Echo 05/28/21 normal LVEF 55-60%, no RWMA, normal diastolic parameters, of thre efindings for constrictive physiology - only one noted, no significant valvular abnormalities.   ED visit 01/09/22 with shaking, high blood pressure, low heart rate. Noted to be recovering from "[employer] syndrome" following treatment of left leg abscess with Septra and doxycycline with generalized papule rash diagnosed in [place] 2 weeks prior treated with prednisone. EKG NSR with PVC's.  CXR, CT chest reassuring. BP 118/75 and HR 82. HS troponin 4 ? 4. CBC Hb15.8, Plt 136. BMP creat 0.96, K 3.5, Ca 8.8.   Seen 01/13/22 in follow up with labile BP and heart rate. ZIO placed which revealed predominantly NSR but did have 44 runs of SVT as well as PVCs. Triggered episodes associated with ST and PAC/PVC. Recommended for Toprol 25mg  daily.    Presents today for follow up independently with his wife on the phone.  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 Endorses following a heart healthy diet.  Since adding metoprolol notes feeling fatigued.  He moved it to bedtime and still feels tiredness if he could take a nap.  Has had previous sleep study which was unremarkable per his  report.  Reports no recent palpitations, tachycardia.  *** He presents today for follow up. His wife was present via phone. He notes he had a few days off around Christmas and the [place] which he enjoyed.   Thursday felt "a little bad" and Friday felt "pressure building" in his left chest. He felt short of breath and when to sit down and rest. Symptoms resolved after 20 minutes of resting and deep breathing. However when he went to move again it recurred .  He has been taking Diltiazem in the evening. Feels well takin git and does not make him feel fatigued.   Has not felt as short of breath today. This week has felt more  normal.   The discomfort is left sided described as "pressure". Tells me he startes to "control" his breathing more.   He is worried  Yesterday around 2pm felt it in his chest a little bit.   No cough, wheeze, fever, swelling, orthopnea, PND.   PCP has started sleep medication and ***   He worked out this morning without difficulty.   Tells me this feels different than when he had pericarditis.   EKGs/Labs/Other Studies Reviewed:   The following studies were reviewed today:  ZIO 02/04/22 Patient had a min HR of 54 bpm, max HR of 160 bpm, and avg HR of 83 bpm. Predominant underlying rhythm was Sinus Rhythm. 44 Supraventricular Tachycardia runs occurred, the run with the fastest interval lasting 7 beats with a max rate of 160 bpm, the  longest lasting 36.2 secs with an avg rate of 120 bpm. Isolated SVEs were rare (<1.0%), SVE Couplets were rare (<1.0%), and SVE Triplets were rare (<1.0%). Isolated VEs were rare (<1.0%, 12567), VE Couplets were rare (<1.0%, 121), and VE Triplets were  rare (<1.0%, 11). Ventricular Bigeminy and Trigeminy were present.    SR/SB/ST Freq PVCs Runs of SVT Needs ROV to discuss  Echo 05/2021 1. Left ventricular ejection fraction, by estimation, is 55 to 60%. The  left ventricle has normal function. The left ventricle has no regional  wall motion abnormalities. Left ventricular diastolic parameters were  normal. There is an interventricular  septal bounce without an annulus reversus. No IVC plethora, IVC spectral  Doppler not performed for hepatic diastolic flow reversal ratio. Of the  three findings assessed for constrictive physiology, only one is seen.   2. Right ventricular systolic function is normal. The right ventricular  size is normal. Tricuspid regurgitation signal is inadequate for assessing  PA pressure.   3. The mitral valve is normal in structure. No evidence of mitral valve  regurgitation. No evidence of mitral stenosis.   4. The aortic  valve is tricuspid. Aortic valve regurgitation is not  visualized. No aortic stenosis is present.   5. The inferior vena cava is normal in size with greater than 50%  respiratory variability, suggesting right atrial pressure of 3 mmHg.   Comparison(s): Unable to access 2008 study or report.   EKG:  EKG ordered today. EKG performed today demonstrates NSR 77 bpm with first degree AV block PR 252 ms. No acute ST/T wave changes.   Recent Labs: 01/09/2022: BUN 16; Creatinine, Ser 0.95; Hemoglobin 15.8; Platelets 136; Potassium 3.5; Sodium 140 01/13/2022: Magnesium 2.4; TSH 2.380  Recent Lipid Panel    Component Value Date/Time   CHOL 121 09/01/2020 1018   TRIG 89 09/01/2020 1018   HDL 42 09/01/2020 1018   CHOLHDL 2.9 09/01/2020 1018   LDLCALC 62 09/01/2020 1018  Home Medications   Current Meds  Medication Sig   acyclovir (ZOVIRAX) 400 MG tablet Take 400 mg by mouth.   Ascorbic Acid (VITAMIN C PO) Take 1 tablet by mouth daily.   Cholecalciferol (VITAMIN D3) 25 MCG (1000 UT) CAPS Take 1 capsule by mouth daily.   diltiazem (CARDIZEM CD) 120 MG 24 hr capsule Take 1 capsule (120 mg total) by mouth daily.   fish oil-omega-3 fatty acids 1000 MG capsule Take 2 g by mouth daily.   magnesium oxide (MAG-OX) 400 MG tablet 1 tablet every other day   metoprolol succinate (TOPROL-XL) 25 MG 24 hr tablet Take 0.5 tablets (12.5 mg total) by mouth as needed. Take with or immediately following a meal.   omeprazole (PRILOSEC) 40 MG capsule Take 40 mg by mouth 2 (two) times daily.    Review of Systems      All other systems reviewed and are otherwise negative except as noted above.  Physical Exam    VS:  BP 114/76   Pulse 77   Ht 5\' 10"  (1.778 m)   Wt 169 lb (76.7 kg)   BMI 24.25 kg/m  , BMI Body mass index is 24.25 kg/m.  Wt Readings from Last 3 Encounters:  07/22/22 169 lb (76.7 kg)  03/16/22 172 lb (78 kg)  01/19/22 165 lb (74.8 kg)    GEN: Well nourished, well developed, in no acute  distress. HEENT: normal. Neck: Supple, no JVD, carotid bruits, or masses. Cardiac: RRR, no murmurs, rubs, or gallops. No clubbing, cyanosis, edema.  Radials/PT 2+ and equal bilaterally.  Respiratory:  Respirations regular and unlabored, clear to auscultation bilaterally. GI: Soft, nontender, nondistended. MS: No deformity or atrophy. Skin: Warm and dry, no rash. Neuro:  Strength and sensation are intact. Psych: Normal affect.  Assessment & Plan    SVT/ PVC  -prior palpitations in setting of SJS.  Monitor with 44 runs of SVT fastest 7 beats rate 160 bpm, longest 36.2 seconds 120 bpm.  Echo November 2022 no significant valve normalities.  Did not tolerate metoprolol succinate well with fatigue.  Will discontinue Toprol.  If fatigue does not improve, further follow up with PCP. We discussed possibly taking alternate medication such as diltiazem as needed for breakthrough palpitations but he understandably prefers to avoid medications.  Encouraged to continue to avoid excessive caffeine, alcohol, manage stress well. He will contact our office for new or worsening palpitations at which time CCB could be considered.   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   Chest pain in adult -   Disposition: Follow up in 6 month(s) with Quay Burow, MD or APP.  Signed, Loel Dubonnet, NP 07/22/2022, 11:28 AM [place]

## 2022-07-23 ENCOUNTER — Encounter (HOSPITAL_BASED_OUTPATIENT_CLINIC_OR_DEPARTMENT_OTHER): Payer: Self-pay | Admitting: Family

## 2022-07-23 LAB — BASIC METABOLIC PANEL
BUN/Creatinine Ratio: 13 (ref 10–24)
BUN: 13 mg/dL (ref 8–27)
CO2: 24 mmol/L (ref 20–29)
Calcium: 8.7 mg/dL (ref 8.6–10.2)
Chloride: 102 mmol/L (ref 96–106)
Creatinine, Ser: 1.04 mg/dL (ref 0.76–1.27)
Glucose: 86 mg/dL (ref 70–99)
Potassium: 4.4 mmol/L (ref 3.5–5.2)
Sodium: 141 mmol/L (ref 134–144)
eGFR: 81 mL/min/{1.73_m2} (ref >59)

## 2022-07-23 LAB — SEDIMENTATION RATE: Sed Rate: 2 mm/hr (ref 0–30)

## 2022-07-23 LAB — CBC
Hematocrit: 44.3 % (ref 37.5–51.0)
Hemoglobin: 15.3 g/dL (ref 13.0–17.7)
MCH: 32.4 pg (ref 26.6–33.0)
MCHC: 34.5 g/dL (ref 31.5–35.7)
MCV: 94 fL (ref 79–97)
Platelets: 156 10*3/uL (ref 150–450)
RBC: 4.72 x10E6/uL (ref 4.14–5.80)
RDW: 12.2 % (ref 11.6–15.4)
WBC: 4.4 10*3/uL (ref 3.4–10.8)

## 2022-07-23 LAB — C-REACTIVE PROTEIN: CRP: 3 mg/L (ref 0–10)

## 2022-07-25 ENCOUNTER — Other Ambulatory Visit (HOSPITAL_COMMUNITY): Payer: Self-pay

## 2022-07-29 NOTE — Telephone Encounter (Signed)
Can  you please be sure CT was ordered on 1/12? TY!

## 2022-07-29 NOTE — Telephone Encounter (Signed)
Got it ordered now. Thanks for catching that!

## 2022-07-29 NOTE — Addendum Note (Signed)
Addended by: Gerald Stabs on: 07/29/2022 07:55 AM   Modules accepted: Orders

## 2022-08-02 ENCOUNTER — Telehealth (HOSPITAL_COMMUNITY): Payer: Self-pay | Admitting: *Deleted

## 2022-08-02 NOTE — Telephone Encounter (Signed)
Reaching out to patient to offer assistance regarding upcoming cardiac imaging study; pt verbalizes understanding of appt date/time, parking situation and where to check in, pre-test NPO status and medications ordered, and verified current allergies; name and call back number provided for further questions should they arise  Zachary Clement RN Navigator Cardiac Imaging [facility] [phone] office [phone] cell  Patient to take 10mg  ivabradine two hours prior to his cardiac CT scan.  He is aware to arrive at 3pm.

## 2022-08-03 ENCOUNTER — Ambulatory Visit (HOSPITAL_COMMUNITY)
Admission: RE | Admit: 2022-08-03 | Discharge: 2022-08-03 | Disposition: A | Discharge status: Home / Self Care | Payer: BC Managed Care – PPO | Source: Ambulatory Visit | Attending: Family | Admitting: Family

## 2022-08-03 DIAGNOSIS — R072 Precordial pain: Secondary | ICD-10-CM | POA: Insufficient documentation

## 2022-08-03 MED ORDER — NITROGLYCERIN 0.4 MG SL SUBL
0.8000 mg | SUBLINGUAL_TABLET | SUBLINGUAL | Status: DC | PRN
Start: 2022-08-03 — End: 2022-08-04
  Administered 2022-08-03: 0.8 mg via SUBLINGUAL

## 2022-08-03 MED ORDER — METOPROLOL TARTRATE 5 MG/5ML IV SOLN: 5.0000 mg | Freq: Once | INTRAVENOUS | Status: DC

## 2022-08-03 MED ORDER — METOPROLOL TARTRATE 5 MG/5ML IV SOLN
INTRAVENOUS | Status: AC
  Filled 2022-08-03: qty 5

## 2022-08-03 MED ORDER — IOHEXOL 350 MG/ML SOLN
95.0000 mL | Freq: Once | INTRAVENOUS | Status: AC | PRN
  Administered 2022-08-03: 95 mL via INTRAVENOUS

## 2022-08-03 MED ORDER — IOHEXOL 350 MG/ML SOLN
100.0000 mL | Freq: Once | INTRAVENOUS | Status: AC | PRN
  Administered 2022-08-03: 100 mL via INTRAVENOUS

## 2022-08-03 MED ORDER — NITROGLYCERIN 0.4 MG SL SUBL
SUBLINGUAL_TABLET | SUBLINGUAL | Status: AC
  Filled 2022-08-03: qty 2

## 2022-08-12 ENCOUNTER — Other Ambulatory Visit (HOSPITAL_BASED_OUTPATIENT_CLINIC_OR_DEPARTMENT_OTHER): Payer: Self-pay | Admitting: Family

## 2022-08-12 DIAGNOSIS — I493 Ventricular premature depolarization: Secondary | ICD-10-CM

## 2022-08-12 DIAGNOSIS — I471 Supraventricular tachycardia, unspecified: Secondary | ICD-10-CM

## 2022-08-12 NOTE — Telephone Encounter (Signed)
Patient of Dr. Berry. Please review for refill. Thank you!  

## 2022-08-22 NOTE — Telephone Encounter (Signed)
Pt. Just wanted to say thank you!

## 2022-08-25 DIAGNOSIS — K649 Unspecified hemorrhoids: Secondary | ICD-10-CM | POA: Diagnosis not present

## 2022-09-13 MED ORDER — METOPROLOL SUCCINATE ER 25 MG PO TB24: 12.5000 mg | ORAL_TABLET | ORAL | 11 refills | Status: DC | PRN

## 2022-09-13 NOTE — Addendum Note (Signed)
Addended by: Gerald Stabs on: 09/13/2022 11:24 AM   Modules accepted: Orders

## 2022-09-20 ENCOUNTER — Ambulatory Visit (HOSPITAL_BASED_OUTPATIENT_CLINIC_OR_DEPARTMENT_OTHER): Payer: BC Managed Care – PPO | Admitting: Family

## 2022-10-11 ENCOUNTER — Encounter (HOSPITAL_BASED_OUTPATIENT_CLINIC_OR_DEPARTMENT_OTHER): Payer: Self-pay | Admitting: Family

## 2022-10-11 ENCOUNTER — Ambulatory Visit (INDEPENDENT_AMBULATORY_CARE_PROVIDER_SITE_OTHER): Payer: BC Managed Care – PPO | Admitting: Family

## 2022-10-11 VITALS — BP 122/66 | HR 62 | Ht 70.0 in | Wt 167.0 lb

## 2022-10-11 DIAGNOSIS — I493 Ventricular premature depolarization: Secondary | ICD-10-CM | POA: Diagnosis not present

## 2022-10-11 DIAGNOSIS — I311 Chronic constrictive pericarditis: Secondary | ICD-10-CM | POA: Diagnosis not present

## 2022-10-11 DIAGNOSIS — I471 Supraventricular tachycardia, unspecified: Secondary | ICD-10-CM | POA: Diagnosis not present

## 2022-10-11 NOTE — Progress Notes (Signed)
Office Visit    Patient Name: [facility] Date of Encounter: 10/11/2022  PCP:  Lurline Del, [facility]  Cardiologist:  Quay Burow, MD  Advanced Practice Provider:  No care team member to display Electrophysiologist:  None      Chief Complaint    Z[place] [facility] is a 62 y.o. male presents today for follow up after cardiac CTA.   Past Medical History    Past Medical History:  Diagnosis Date   Arthritis    Dizziness    SINCE TAKING FLUID PILL   Granulomatous hepatitis    H/O hiatal hernia    Hemorrhoids    Pericarditis    H/O, chronic calcific,normal stress cardiolite  10/20/2003 EF 52%   Pulmonary edema    CURRENTLY HAS ABNORMAL CXR - FOLLOWED BY CARDIOLOGIST AT [facility]   PVC (premature ventricular contraction)    Shortness of breath    WITH EXERTION -    SVT (supraventricular tachycardia)    Ulcer    NON-BLEEDING STOMACH ULCER   Ventricular fibrillation    POST OP PARICARDECTOMY - NO EPISODES SINCE   Past Surgical History:  Procedure Laterality Date   BAND HEMORRHOIDECTOMY     EXCISIONAL HEMORRHOIDECTOMY     LIVER BIOPSY     LUNG SURGERY  2013   pericardial stripping  08/19/11   [facility]   TONSILLECTOMY      Allergies  Allergies  Allergen Reactions   Cottonseed Oil Hives   Flax Seed Oil Hives   Other Hives    Dark chocolate, hot peppers    Sulfa Antibiotics     Other reaction(s): shortness of breath; Stevens-Johnson Syndrome    History of Present Illness    Zachary L[facility] is a 62 y.o. male with a hx of pericarditis s/p radical pericardiectomy at [facility] [facility] in [place] 2013 with subsequent VATS and pleurodesis, GERD last seen 07/22/22. Family history notable for coronary disease.   Previously followed at [facility] in [place]. 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 Soon after had VATS with pleurodesis because of recurrent pleural effusion. CT chest [facility] 06/2020  unremarkable. Echo EF 40-45%, normal valvular function. Established with Dr. Gwenlyn Found 09/01/20. Echo 05/28/21 normal LVEF 55-60%, no RWMA, normal diastolic parameters, of thre efindings for constrictive physiology - only one noted, no significant valvular abnormalities.   ED visit 01/09/22 with shaking, high blood pressure, low heart rate. Noted to be recovering from "[employer] syndrome" following treatment of left leg abscess with Septra and doxycycline with generalized papule rash diagnosed in [place] 2 weeks prior treated with prednisone. EKG NSR with PVC's.  CXR, CT chest reassuring. BP 118/75 and HR 82. HS troponin 4 ? 4. CBC Hb15.8, Plt 136. BMP creat 0.96, K 3.5, Ca 8.8.   Seen 01/13/22 in follow up with labile BP and heart rate. ZIO placed which revealed predominantly NSR but did have 44 runs of SVT as well as PVCs. Triggered episodes associated with ST and PAC/PVC. Recommended for Toprol 25mg  daily.  At follow up 03/2022 noted fatigue on Toprol and was started on Diltiazem.   Seen 07/30/2022 noting episodes of chest discomfort.  Cardiac CTA 08/03/2022 coronary calcium score of 0 but did note several focal pericardial calcifications.  Subsequent sed rate and CRP were normal.  He sent a MyChart message noting more fatigue after getting over COVID.  He noted some chest discomfort which is improved after resuming Toprol  12.5 mg daily.  He presents today for follow up. His wife was present via phone. 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 Enjoys walking at the park with his wife. Endorses following a heart healthy diet. Since last seen he had COVID. Noted since last Thursday had has resolution of chest pressure he was having post-COVID. He is tolerating half tablet of Metoprolol with his Diltiazem without fatigue.   EKGs/Labs/Other Studies Reviewed:   The following studies were reviewed today:  Coronary CTA 07/2022 IMPRESSION: 1.  Coronary calcium score of 0.   2. Normal coronary origin with right dominance.   3. Normal coronary arteries.   4. Several focal pericardial calcifications noted. Would consider work-up for pericarditis.   RECOMMENDATIONS: 1. No evidence of CAD (0%). Consider non-atherosclerotic causes of chest pain.    ZIO 02/04/22 Patient had a min HR of 54 bpm, max HR of 160 bpm, and avg HR of 83 bpm. Predominant underlying rhythm was Sinus Rhythm. 44 Supraventricular Tachycardia runs occurred, the run with the fastest interval lasting 7 beats with a max rate of 160 bpm, the  longest lasting 36.2 secs with an avg rate of 120 bpm. Isolated SVEs were rare (<1.0%), SVE Couplets were rare (<1.0%), and SVE Triplets were rare (<1.0%). Isolated VEs were rare (<1.0%, 12567), VE Couplets were rare (<1.0%, 121), and VE Triplets were  rare (<1.0%, 11). Ventricular Bigeminy and Trigeminy were present.    SR/SB/ST Freq PVCs Runs of SVT Needs ROV to discuss  Echo 05/2021 1. Left ventricular ejection fraction, by estimation, is 55 to 60%. The  left ventricle has normal function. The left ventricle has no regional  wall motion abnormalities. Left ventricular diastolic parameters were  normal. There is an interventricular  septal bounce without an annulus reversus. No IVC plethora, IVC spectral  Doppler not performed for hepatic diastolic flow reversal ratio. Of the  three findings assessed for constrictive physiology, only one is seen.   2. Right ventricular systolic function is normal. The right ventricular  size is normal. Tricuspid regurgitation signal is inadequate for assessing  PA pressure.   3. The mitral valve is normal in structure. No evidence of mitral valve  regurgitation. No evidence of mitral stenosis.   4. The aortic valve is tricuspid. Aortic valve regurgitation is not  visualized. No aortic stenosis is present.   5. The inferior vena cava is normal in size with greater than 50%  respiratory  variability, suggesting right atrial pressure of 3 mmHg.   Comparison(s): Unable to access 2008 study or report.   EKG:  EKG not ordered today. Recent Labs: 01/13/2022: Magnesium 2.4; TSH 2.380 07/22/2022: BUN 13; Creatinine, Ser 1.04; Hemoglobin 15.3; Platelets 156; Potassium 4.4; Sodium 141  Recent Lipid Panel    Component Value Date/Time   CHOL 121 09/01/2020 1018   TRIG 89 09/01/2020 1018   HDL 42 09/01/2020 1018   CHOLHDL 2.9 09/01/2020 1018   LDLCALC 62 09/01/2020 1018     Home Medications   Current Meds  Medication Sig   acyclovir (ZOVIRAX) 400 MG tablet Take 400 mg by mouth.   Ascorbic Acid (VITAMIN C PO) Take 1 tablet by mouth daily.   Cholecalciferol (VITAMIN D3) 25 MCG (1000 UT) CAPS Take 1 capsule by mouth daily.   diltiazem (CARDIZEM CD) 120 MG 24 hr capsule Take 1 capsule (120 mg total) by mouth daily.   fish oil-omega-3 fatty acids 1000 MG  capsule Take 2 g by mouth daily.   magnesium oxide (MAG-OX) 400 MG tablet 1 tablet every other day   metoprolol succinate (TOPROL-XL) 25 MG 24 hr tablet Take 0.5 tablets (12.5 mg total) by mouth as needed. Take with or immediately following a meal.   omeprazole (PRILOSEC) 40 MG capsule Take 40 mg by mouth 2 (two) times daily.   traZODone (DESYREL) 100 MG tablet Take 150 mg by mouth daily.    Review of Systems      All other systems reviewed and are otherwise negative except as noted above.  Physical Exam    VS:  BP 122/66   Pulse 62   Ht 5\' 10"  (1.778 m)   Wt 167 lb (75.8 kg)   BMI 23.96 kg/m  , BMI Body mass index is 23.96 kg/m.  Wt Readings from Last 3 Encounters:  10/11/22 167 lb (75.8 kg)  07/22/22 169 lb (76.7 kg)  03/16/22 172 lb (78 kg)    GEN: Well nourished, well developed, in no acute distress. HEENT: normal. Neck: Supple, no JVD, carotid bruits, or masses. Cardiac: RRR, no murmurs, rubs, or gallops. No clubbing, cyanosis, edema.  Radials/PT 2+ and equal bilaterally.  Respiratory:  Respirations regular  and unlabored, clear to auscultation bilaterally. GI: Soft, nontender, nondistended. MS: No deformity or atrophy. Skin: Warm and dry, no rash. Neuro:  Strength and sensation are intact. Psych: Normal affect.  Assessment & Plan    Chest pressure - Cardiac CTA 07/2022 with coronary calcium score of 0. He had recurrent chest pressure after COVID recently which resolved with half tablet of Toprol. Prefers fewer medications, will change Toprol to 12.5mg  PRN. If has recurrent symptoms, will resume daily dosing.   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  Monitor with 44 runs of SVT fastest 7 beats rate 160 bpm, longest 36.2 seconds 120 bpm.  Echo November 2022 no significant valve normalities.  Palpitations have been quiescent on  Diltiazem 120mg  daily.  Constrictive pericarditis s/p radical pericardectomy 2013 - Present chest pain feels different than previous pericarditis. CT 08/03/22 with several focal pericardial calcifications. Labs 07/22/22 CRP, sed rate. No pleural rub on exam.   Disposition: Follow up in 6 month(s) with Quay Burow, MD or APP.  Signed, Loel Dubonnet, NP 10/11/2022, 8:39 AM [place]

## 2022-10-11 NOTE — Patient Instructions (Signed)
Medication Instructions:  Your physician has recommended you make the following change in your medication:  Change: Metoprolol to as needed- if you start to have palpitations or pressure just let us know!   *If you need a refill on your cardiac medications before your next appointment, please call your pharmacy*  Follow-Up: At [facility], you and your health needs are our priority.  As part of our continuing mission to provide you with exceptional heart care, we have created designated Provider Care Teams.  These Care Teams include your primary Cardiologist (physician) and Advanced Practice Providers (APPs -  Physician Assistants and Nurse Practitioners) who all work together to provide you with the care you need, when you need it.  We recommend signing up for the patient portal called "MyChart".  Sign up information is provided on this After Visit Summary.  MyChart is used to connect with patients for Virtual Visits (Telemedicine).  Patients are able to view lab/test results, encounter notes, upcoming appointments, etc.  Non-urgent messages can be sent to your provider as well.   To learn more about what you can do with MyChart, go to [email or web address].    Your next appointment:   6 month(s)  Provider:   Skeet Latch, MD

## 2022-10-19 DIAGNOSIS — K219 Gastro-esophageal reflux disease without esophagitis: Secondary | ICD-10-CM | POA: Diagnosis not present

## 2022-10-19 DIAGNOSIS — I471 Supraventricular tachycardia, unspecified: Secondary | ICD-10-CM | POA: Diagnosis not present

## 2022-10-19 DIAGNOSIS — D721 Eosinophilia, unspecified: Secondary | ICD-10-CM | POA: Diagnosis not present

## 2022-10-19 DIAGNOSIS — Z6825 Body mass index (BMI) 25.0-25.9, adult: Secondary | ICD-10-CM | POA: Diagnosis not present

## 2022-11-02 ENCOUNTER — Telehealth (HOSPITAL_BASED_OUTPATIENT_CLINIC_OR_DEPARTMENT_OTHER): Payer: Self-pay | Admitting: Family

## 2022-11-02 NOTE — Telephone Encounter (Signed)
Group 2 plaque report reviewed as part of DECIDE Registry.   LAD score of 4 mm3 calcified plaque and 20 mm3 non calcified plaque. Given new finding of noncalcified plaque, recommend addition of Rosuvastatin  daily (moderate intensity statin).  Information sent to [name] via [employer].  Did you add a medication? Yes.    ?If yes, how many? Number of meds added: 1  ?If no, reason? Reason for not adding med: Other N/A  Did you remove a medication? No.  Did you increase the dosage of any medication? No.  Did you decrease the dosage of any medication? No.  Did you refer to a specialist (i.e. lipid clinic, preventive cardiology, endocrinology)? No.  Has patient seen plaque report? No.    Zachary Sorrow, NP

## 2022-11-14 ENCOUNTER — Other Ambulatory Visit (HOSPITAL_BASED_OUTPATIENT_CLINIC_OR_DEPARTMENT_OTHER): Payer: Self-pay

## 2022-11-14 DIAGNOSIS — I493 Ventricular premature depolarization: Secondary | ICD-10-CM

## 2022-11-14 DIAGNOSIS — I471 Supraventricular tachycardia, unspecified: Secondary | ICD-10-CM

## 2022-11-14 MED ORDER — DILTIAZEM HCL ER COATED BEADS 120 MG PO CP24: 120.0000 mg | ORAL_CAPSULE | Freq: Every day | ORAL | 3 refills | Status: DC

## 2022-11-14 NOTE — Telephone Encounter (Signed)
From: [name] To: Office of Alver Sorrow, NP Sent: 11/14/2022 8:47 AM EDT Subject: Medication Renewal Request  Refills have been requested for the following medications:   diltiazem (CARDIZEM CD) 120 MG 24 hr capsule [Jonathan Berry]  Patient Comment: I have 5 capsule left, need a refill. Thank you  Preferred pharmacy: [id number] - [place], [place] - [address] Delivery method: [employer]

## 2022-12-20 ENCOUNTER — Encounter (HOSPITAL_BASED_OUTPATIENT_CLINIC_OR_DEPARTMENT_OTHER): Payer: Self-pay

## 2022-12-23 NOTE — Telephone Encounter (Signed)
For your review

## 2023-01-09 NOTE — Telephone Encounter (Signed)
Here ya go!  

## 2023-01-30 MED ORDER — METOPROLOL SUCCINATE ER 25 MG PO TB24: 12.5000 mg | ORAL_TABLET | ORAL | 11 refills | Status: DC | PRN

## 2023-04-07 ENCOUNTER — Encounter: Payer: Self-pay | Admitting: Pharmacist

## 2023-04-07 ENCOUNTER — Telehealth: Payer: Self-pay | Admitting: Pharmacy Technician

## 2023-04-07 ENCOUNTER — Other Ambulatory Visit: Payer: Self-pay | Admitting: Pharmacist

## 2023-04-07 ENCOUNTER — Ambulatory Visit (HOSPITAL_BASED_OUTPATIENT_CLINIC_OR_DEPARTMENT_OTHER): Payer: BC Managed Care – PPO | Admitting: Family

## 2023-04-07 ENCOUNTER — Other Ambulatory Visit (HOSPITAL_COMMUNITY): Payer: Self-pay

## 2023-04-07 ENCOUNTER — Encounter (HOSPITAL_BASED_OUTPATIENT_CLINIC_OR_DEPARTMENT_OTHER): Payer: Self-pay

## 2023-04-07 ENCOUNTER — Encounter (HOSPITAL_BASED_OUTPATIENT_CLINIC_OR_DEPARTMENT_OTHER): Payer: Self-pay | Admitting: Family

## 2023-04-07 ENCOUNTER — Other Ambulatory Visit (HOSPITAL_BASED_OUTPATIENT_CLINIC_OR_DEPARTMENT_OTHER): Payer: Self-pay | Admitting: Family

## 2023-04-07 VITALS — BP 104/52 | HR 67 | Ht 70.0 in | Wt 169.8 lb

## 2023-04-07 DIAGNOSIS — I311 Chronic constrictive pericarditis: Secondary | ICD-10-CM

## 2023-04-07 DIAGNOSIS — I471 Supraventricular tachycardia, unspecified: Secondary | ICD-10-CM

## 2023-04-07 DIAGNOSIS — I2510 Atherosclerotic heart disease of native coronary artery without angina pectoris: Secondary | ICD-10-CM

## 2023-04-07 DIAGNOSIS — E785 Hyperlipidemia, unspecified: Secondary | ICD-10-CM

## 2023-04-07 DIAGNOSIS — I493 Ventricular premature depolarization: Secondary | ICD-10-CM | POA: Diagnosis not present

## 2023-04-07 MED ORDER — LODOCO 0.5 MG PO TABS: 0.5000 mg | ORAL_TABLET | Freq: Every day | ORAL | 5 refills | Status: DC

## 2023-04-07 MED ORDER — LODOCO 0.5 MG PO TABS: 1.0000 | ORAL_TABLET | Freq: Every day | ORAL | Status: AC

## 2023-04-07 MED ORDER — ROSUVASTATIN CALCIUM 5 MG PO TABS: 5.0000 mg | ORAL_TABLET | Freq: Every day | ORAL | 3 refills | Status: DC

## 2023-04-07 MED ORDER — LODOCO 0.5 MG PO TABS: 1.0000 | ORAL_TABLET | Freq: Every day | ORAL | Status: DC

## 2023-04-07 NOTE — Telephone Encounter (Signed)
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 Thanks!  Alver Sorrow, NP

## 2023-04-07 NOTE — Progress Notes (Signed)
Providing Lodoco samples per Gillian Shields

## 2023-04-07 NOTE — Patient Instructions (Addendum)
Medication Instructions:  Your physician has recommended you make the following change in your medication:   START Colchicine 0.5mg  daily  START Rosuvastatin 5mg  daily  *If you need a refill on your cardiac medications before your next appointment, please call your pharmacy*   Lab Work: Your physician recommends that you return for lab work in 6 weeks: fasting lipid panel, liver function tests  If you have labs (blood work) drawn today and your tests are completely normal, you will receive your results only by: MyChart Message (if you have MyChart) OR A paper copy in the mail If you have any lab test that is abnormal or we need to change your treatment, we will call you to review the results.  Follow-Up: At [facility] you and your health needs are our priority.  As part of our continuing mission to provide you with exceptional heart care, we have created designated Provider Care Teams.  These Care Teams include your primary Cardiologist (physician) and Advanced Practice Providers (APPs -  Physician Assistants and Nurse Practitioners) who all work together to provide you with the care you need, when you need it.  We recommend signing up for the patient portal called "MyChart".  Sign up information is provided on this After Visit Summary.  MyChart is used to connect with patients for Virtual Visits (Telemedicine).  Patients are able to view lab/test results, encounter notes, upcoming appointments, etc.  Non-urgent messages can be sent to your provider as well.   To learn more about what you can do with MyChart, go to [email or web address].    Your next appointment:   2-3 month(s)  Provider:   Nanetta Batty, MD  or Alver Sorrow, NP    Other Instructions  There are two weeks of Samples of Colchicine for at the [facility] if you would like to pick them up!

## 2023-04-07 NOTE — Telephone Encounter (Signed)
Attempted to call patient, no answer, left message asking patient to call back.

## 2023-04-07 NOTE — Telephone Encounter (Signed)
Patient sent separate mychart message, responded to patient there.

## 2023-04-07 NOTE — Progress Notes (Signed)
This encounter was created in error - please disregard.

## 2023-04-07 NOTE — Progress Notes (Signed)
Cardiology Office Note:  .   Date:  04/07/2023  ID:  [name], DOB 10-23-1960, MRN [id number] PCP: Jackelyn Poling, DO  [facility] Providers Cardiologist:  Nanetta Batty, MD    History of Present Illness: .   AKIRA ADELSBERGER is a 62 y.o. male  with a hx of pericarditis s/p radical pericardiectomy at [facility] in [place] 2013 with subsequent VATS and pleurodesis, GERD. Family history notable for coronary disease.    Previously followed at [facility] in [place]. 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 Soon after had VATS with pleurodesis because of recurrent pleural effusion. CT chest [facility] 06/2020 unremarkable. Echo EF 40-45%, normal valvular function. Established with Dr. Allyson Sabal 09/01/20. Echo 05/28/21 normal LVEF 55-60%, no RWMA, normal diastolic parameters, of thre efindings for constrictive physiology - only one noted, no significant valvular abnormalities.    ED visit 01/09/22 with shaking, high blood pressure, low heart rate. 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 EKG NSR with PVC's.  CXR, CT chest reassuring. BP 118/75 and HR 82. HS troponin 4 ? 4. CBC Hb15.8, Plt 136. BMP creat 0.96, K 3.5, Ca 8.8.    Seen 01/13/22 in follow up with labile BP and heart rate. ZIO placed which revealed predominantly NSR but did have 44 runs of SVT as well as PVCs. Triggered episodes associated with ST and PAC/PVC. Recommended for Toprol 25mg  daily.  At follow up 03/2022 noted fatigue on Toprol and was started on Diltiazem.    Seen 07/30/2022 noting episodes of chest discomfort.  Cardiac CTA 08/03/2022 coronary calcium score of 0 but did note several focal pericardial calcifications.  Subsequent sed rate and CRP were normal.   He sent a MyChart message 10/2022 with chest discomfort  which is improved after resuming Toprol 12.5 mg daily.   He presents today for follow up. Noticing left-sided thoracic pain at rest that is associated with "labored" breathing and extreme fatigue. Takes additional 12.5mg  Toprol and symptoms resolve, but has to lay down for half an hour and rest as feels more fatigued if he takes additional Toprol. Works out three times per week without symptoms. Did recently work overtime and complete an extensive home project, and feels that episodes triggered by stress. Checking BP at home, around 110s usually. No dizziness or syncopal episodes. Reports no shortness of breath nor dyspnea on exertion. No edema, orthopnea, PND. Reports no palpitations.   ROS: Please see the history of present illness.    All other systems reviewed and are negative.   Studies Reviewed: Marland Kitchen   EKG Interpretation Date/Time:  Friday April 07 2023 10:30:09 EDT Ventricular Rate:  67 PR Interval:  220 QRS Duration:  86 QT Interval:  394 QTC Calculation: 416 R Axis:   93  Text Interpretation: Sinus rhythm with 1st degree A-V block Rightward axis Confirmed by Gillian Shields ([id number]) on 04/07/2023 10:32:13 AM    Cardiac Studies & Procedures     STRESS TESTS  MYOCARDIAL PERFUSION IMAGING 10/20/2003   ECHOCARDIOGRAM  ECHOCARDIOGRAM COMPLETE 05/28/2021  Narrative ECHOCARDIOGRAM REPORT    Patient Name:   [name] Date of Exam: 05/28/2021 Medical Rec #:  [id number]        Height:       69.0 in Accession #:    [id number]  Weight:       164.0 lb Date of Birth:  06-27-1961        BSA:          1.899 m Patient Age:    60 years         BP:           112/70 mmHg Patient Gender: M                HR:           64 bpm. 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  Chronic constrictive idiopathic pericarditis; Risk Factors:Dyslipidemia.  Sonographer:    Samule Ohm RDCS Referring Phys: [phone] JONATHAN J BERRY  IMPRESSIONS   1. Left ventricular ejection fraction, by estimation, is 55 to 60%. The left ventricle has normal function. The left ventricle has no regional wall motion abnormalities. Left ventricular diastolic parameters were normal. There is an interventricular septal bounce without an annulus reversus. No IVC plethora, IVC spectral Doppler not performed for hepatic diastolic flow reversal ratio. Of the three findings assessed for constrictive physiology, only one is seen. 2. Right ventricular systolic function is normal. The right ventricular size is normal. Tricuspid regurgitation signal is inadequate for assessing PA pressure. 3. The mitral valve is normal in structure. No evidence of mitral valve regurgitation. No evidence of mitral stenosis. 4. The aortic valve is tricuspid. Aortic valve regurgitation is not visualized. No aortic stenosis is present. 5. The inferior vena cava is normal in size with greater than 50% respiratory variability, suggesting right atrial pressure of 3 mmHg.  Comparison(s): Unable to access 2008 study or report.  FINDINGS Left Ventricle: Left ventricular ejection fraction, by estimation, is 55 to 60%. The left ventricle has normal function. The left ventricle has no regional wall motion abnormalities. The left ventricular internal cavity size was normal in size. There is no left ventricular hypertrophy. Left ventricular diastolic parameters were normal.  Right Ventricle: The right ventricular size is normal. No increase in right ventricular wall thickness. Right ventricular systolic function is normal. Tricuspid regurgitation signal is inadequate for assessing PA pressure.  Left Atrium: Left atrial size was normal in size.  Right Atrium: Right atrial size was normal in size.  Pericardium: There is no evidence  of pericardial effusion.  Mitral Valve: The mitral valve is normal in structure. No evidence of mitral valve regurgitation. No evidence of mitral valve stenosis.  Tricuspid Valve: The tricuspid valve is normal in structure. Tricuspid valve regurgitation is trivial. No evidence of tricuspid stenosis.  Aortic Valve: The aortic valve is tricuspid. Aortic valve regurgitation is not visualized. No aortic stenosis is present.  Pulmonic Valve: The pulmonic valve was normal in structure. Pulmonic valve regurgitation is not visualized. No evidence of pulmonic stenosis.  Aorta: The aortic root and ascending aorta are structurally normal, with no evidence of dilitation.  Venous: The inferior vena cava is normal in size with greater than 50% respiratory variability, suggesting right atrial pressure of 3 mmHg.  IAS/Shunts: The atrial septum is grossly normal.   LEFT VENTRICLE PLAX 2D LVIDd:         4.20 cm   Diastology LVIDs:         3.10 cm   LV e' medial:    10.40 cm/s LV  PW:         1.20 cm   LV E/e' medial:  6.7 LV IVS:        1.00 cm   LV e' lateral:   14.50 cm/s LVOT diam:     2.20 cm   LV E/e' lateral: 4.8 LV SV:         60 LV SV Index:   32 LVOT Area:     3.80 cm   RIGHT VENTRICLE             IVC RV S prime:     10.70 cm/s  IVC diam: 1.40 cm TAPSE (M-mode): 1.3 cm RVSP:           26.4 mmHg  LEFT ATRIUM             Index        RIGHT ATRIUM           Index LA diam:        4.10 cm 2.16 cm/m   RA Pressure: 3.00 mmHg LA Vol (A2C):   50.5 ml 26.59 ml/m  RA Area:     12.40 cm LA Vol (A4C):   49.9 ml 26.28 ml/m  RA Volume:   28.00 ml  14.75 ml/m LA Biplane Vol: 53.9 ml 28.38 ml/m AORTIC VALVE LVOT Vmax:   87.80 cm/s LVOT Vmean:  57.900 cm/s LVOT VTI:    0.159 m  AORTA Ao Root diam: 3.70 cm Ao Asc diam:  3.30 cm  MITRAL VALVE               TRICUSPID VALVE MV Area (PHT): 2.86 cm    TR Peak grad:   23.4 mmHg MV Decel Time: 265 msec    TR Vmax:        242.00 cm/s MV E  velocity: 69.20 cm/s  Estimated RAP:  3.00 mmHg MV A velocity: 71.80 cm/s  RVSP:           26.4 mmHg MV E/A ratio:  0.96 SHUNTS Systemic VTI:  0.16 m Systemic Diam: 2.20 cm  Riley Lam MD Electronically signed by Riley Lam MD Signature Date/Time: 05/28/2021/6:48:47 PM    Final    MONITORS  LONG TERM MONITOR (3-14 DAYS) 02/04/2022  Narrative Patch Wear Time:  13 days and 10 hours (2023-07-06T09:08:45-0400 to 2023-07-19T20:04:05-399)  Patient had a min HR of 54 bpm, max HR of 160 bpm, and avg HR of 83 bpm. Predominant underlying rhythm was Sinus Rhythm. 44 Supraventricular Tachycardia runs occurred, the run with the fastest interval lasting 7 beats with a max rate of 160 bpm, the longest lasting 36.2 secs with an avg rate of 120 bpm. Isolated SVEs were rare (<1.0%), SVE Couplets were rare (<1.0%), and SVE Triplets were rare (<1.0%). Isolated VEs were rare (<1.0%, 12567), VE Couplets were rare (<1.0%, 121), and VE Triplets were rare (<1.0%, 11). Ventricular Bigeminy and Trigeminy were present.  SR/SB/ST Freq PVCs Runs of SVT Needs ROV to discuss   CT SCANS  CT CORONARY MORPH W/CTA COR W/SCORE 08/03/2022  Addendum 08/04/2022 10:22 AM ADDENDUM REPORT: 08/04/2022 10:19  EXAM: OVER-READ INTERPRETATION  CT CHEST  The following report is an over-read performed by radiologist Dr. Noe Gens [facility] on 08/04/2022. This over-read does not include interpretation of cardiac or coronary anatomy or pathology. The coronary CTA interpretation by the cardiologist is attached.  COMPARISON:  01/09/2022  FINDINGS: Heart is normal size. Aorta normal caliber. Pericardial calcifications again noted, unchanged. No adenopathy. No confluent  airspace opacities. Linear scarring at the left lung base, unchanged. No acute findings in the upper abdomen. Chest wall soft tissues are unremarkable. No acute bony abnormality.  IMPRESSION: No acute extra  cardiac abnormality.  Scattered pericardial calcifications, stable.  Left basilar scarring.   Electronically Signed By: Charlett Nose M.D. On: 08/04/2022 10:19  Narrative CLINICAL DATA:  Chest pain  EXAM: Cardiac/Coronary CTA  TECHNIQUE: A non-contrast, gated CT scan was obtained with axial slices of 3 mm through the heart for calcium scoring. Calcium scoring was performed using the Agatston method. A 120 kV prospective, gated, contrast cardiac scan was obtained. Gantry rotation speed was 250 msecs and collimation was 0.6 mm. Two sublingual nitroglycerin tablets (0.8 mg) were given. The 3D data set was reconstructed in 5% intervals of the 35-75% of the R-R cycle. Diastolic phases were analyzed on a dedicated workstation using MPR, MIP, and VRT modes. The patient received 95 cc of contrast.  FINDINGS: Image quality: Excellent.  Noise artifact is: Limited.  Coronary Arteries:  Normal coronary origin.  Right dominance.  Left main: The left main is a large caliber vessel with a normal take off from the left coronary cusp that bifurcates to form a left anterior descending artery and a left circumflex artery. There is no plaque or stenosis.  Left anterior descending artery: The LAD is patent without evidence of plaque or stenosis. The LAD gives off 2 patent diagonal branches.  Left circumflex artery: The LCX is non-dominant and patent with no evidence of plaque or stenosis. The LCX gives off 1 patent obtuse marginal branch.  Right coronary artery: The RCA is dominant with normal take off from the right coronary cusp. There is no evidence of plaque or stenosis. The RCA terminates as a PDA and right posterolateral branch without evidence of plaque or stenosis.  Right Atrium: Right atrial size is within normal limits.  Right Ventricle: The right ventricular cavity is within normal limits.  Left Atrium: Left atrial size is normal in size with no left atrial appendage  filling defect. Small PFO.  Left Ventricle: The ventricular cavity size is within normal limits.  Pulmonary arteries: Normal in size without proximal filling defect.  Pulmonary veins: Normal pulmonary venous drainage.  Pericardium: Normal thickness. Several focal pericardial calcifications noted.  Cardiac valves: The aortic valve is trileaflet without significant calcification. The mitral valve is normal without significant calcification.  Aorta: Mild dilation of the aortic root up to 39 mm. Ascending aorta is normal caliber.  Extra-cardiac findings: See attached radiology report for non-cardiac structures.  IMPRESSION: 1. Coronary calcium score of 0.  2. Normal coronary origin with right dominance.  3. Normal coronary arteries.  4. Several focal pericardial calcifications noted. Would consider work-up for pericarditis.  RECOMMENDATIONS: 1. No evidence of CAD (0%). Consider non-atherosclerotic causes of chest pain.  Lennie Odor, MD  Electronically Signed: By: Lennie Odor M.D. On: 08/03/2022 20:57          Risk Assessment/Calculations:             Physical Exam:   VS:  BP (!) 104/52   Pulse 67   Ht 5\' 10"  (1.778 m)   Wt 169 lb 12.8 oz (77 kg)   SpO2 98%   BMI 24.36 kg/m    Wt Readings from Last 3 Encounters:  04/07/23 169 lb 12.8 oz (77 kg)  10/11/22 167 lb (75.8 kg)  07/22/22 169 lb (76.7 kg)    GEN: Well nourished, well developed in no acute  distress NECK: No JVD; No carotid bruits CARDIAC: RRR, no murmurs, rubs, gallops RESPIRATORY:  Clear to auscultation without rales, wheezing or rhonchi  ABDOMEN: Soft, non-tender, non-distended EXTREMITIES:  No edema; No deformity   ASSESSMENT AND PLAN: .    Chest pressure / minimal nonobstructive CAD / HLD, LDL goal <70 - Cardiac CTA 07/2022 with coronary calcium score of 0 but LAD score of 4 mm3 calcified plaque and 20 mm3 non calcified plaque by DECIDE Registry. Minimal nonobstructive CAD. Add  rosuvastatin 5mg  daily with FLP/LFT in 6 weeks. Recommend aiming for 150 minutes of moderate intensity activity per week and following a heart healthy diet.  Add Colchicine 0.5mg  daily, as below.   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 Monitor 01/2022 with 44 runs of SVT fastest 7 beats rate 160 bpm, longest 36.2 seconds 120 bpm.  Echo November 2022 no significant valve normalities. Palpitations have been quiescent on Diltiazem 120mg  daily and metoprolol succinate 12.5mg  daily.    Constrictive pericarditis s/p radical pericardectomy 2013 - CT 08/03/22 with several focal pericardial calcifications. Labs 07/22/22 CRP, sed rate normal. No pleural rub on exam. Still having left-sided thoracic discomfort. Symptoms are somewhat atypical as worsened with stress but not with exertion nor position. Will start colchicine 0.5 mg daily for reduction of cardiovascular risk and prophylaxis of recurrent pericarditis.        Dispo: Follow-up in clinic in 2-3 months.  Signed, Alver Sorrow, NP

## 2023-04-07 NOTE — Telephone Encounter (Signed)
Pharmacy Patient Advocate Encounter  Insurance verification completed.    The patient is insured through  [employer]    Ran test claim for LODOCO. Currently a quantity of 30 is a 30 day supply and the co-pay is $130.00 .   This test claim was processed through [facility]- copay amounts may vary at other pharmacies due to pharmacy/plan contracts, or as the patient moves through the different stages of their insurance plan.

## 2023-04-07 NOTE — Telephone Encounter (Signed)
Patient's preferred pharmacy unable to fill at this time, left message for patient to call back to see if [facility] or have mailed to him.

## 2023-04-18 DIAGNOSIS — Z125 Encounter for screening for malignant neoplasm of prostate: Secondary | ICD-10-CM | POA: Diagnosis not present

## 2023-04-18 DIAGNOSIS — G4700 Insomnia, unspecified: Secondary | ICD-10-CM | POA: Diagnosis not present

## 2023-04-18 DIAGNOSIS — Z23 Encounter for immunization: Secondary | ICD-10-CM | POA: Diagnosis not present

## 2023-04-18 DIAGNOSIS — Z79899 Other long term (current) drug therapy: Secondary | ICD-10-CM | POA: Diagnosis not present

## 2023-04-18 DIAGNOSIS — Z0000 Encounter for general adult medical examination without abnormal findings: Secondary | ICD-10-CM | POA: Diagnosis not present

## 2023-04-18 DIAGNOSIS — K219 Gastro-esophageal reflux disease without esophagitis: Secondary | ICD-10-CM | POA: Diagnosis not present

## 2023-04-18 DIAGNOSIS — H6122 Impacted cerumen, left ear: Secondary | ICD-10-CM | POA: Diagnosis not present

## 2023-04-18 DIAGNOSIS — Z5181 Encounter for therapeutic drug level monitoring: Secondary | ICD-10-CM | POA: Diagnosis not present

## 2023-05-17 DIAGNOSIS — J069 Acute upper respiratory infection, unspecified: Secondary | ICD-10-CM | POA: Diagnosis not present

## 2023-06-22 DIAGNOSIS — R21 Rash and other nonspecific skin eruption: Secondary | ICD-10-CM | POA: Diagnosis not present

## 2023-06-22 DIAGNOSIS — Z23 Encounter for immunization: Secondary | ICD-10-CM | POA: Diagnosis not present

## 2023-06-27 ENCOUNTER — Encounter (HOSPITAL_BASED_OUTPATIENT_CLINIC_OR_DEPARTMENT_OTHER): Payer: Self-pay

## 2023-06-27 DIAGNOSIS — R21 Rash and other nonspecific skin eruption: Secondary | ICD-10-CM | POA: Diagnosis not present

## 2023-06-27 DIAGNOSIS — G4700 Insomnia, unspecified: Secondary | ICD-10-CM | POA: Diagnosis not present

## 2023-06-27 NOTE — Telephone Encounter (Signed)
Pt asking for dr's note.

## 2023-06-30 ENCOUNTER — Encounter (HOSPITAL_BASED_OUTPATIENT_CLINIC_OR_DEPARTMENT_OTHER): Payer: Self-pay | Admitting: Family

## 2023-07-06 ENCOUNTER — Encounter (HOSPITAL_BASED_OUTPATIENT_CLINIC_OR_DEPARTMENT_OTHER): Payer: Self-pay | Admitting: Family

## 2023-07-10 ENCOUNTER — Ambulatory Visit (HOSPITAL_BASED_OUTPATIENT_CLINIC_OR_DEPARTMENT_OTHER): Payer: BC Managed Care – PPO | Admitting: Family

## 2023-07-10 ENCOUNTER — Encounter (HOSPITAL_BASED_OUTPATIENT_CLINIC_OR_DEPARTMENT_OTHER): Payer: Self-pay | Admitting: Family

## 2023-07-10 VITALS — BP 124/60 | HR 73 | Ht 70.0 in | Wt 174.3 lb

## 2023-07-10 DIAGNOSIS — I493 Ventricular premature depolarization: Secondary | ICD-10-CM | POA: Diagnosis not present

## 2023-07-10 DIAGNOSIS — I311 Chronic constrictive pericarditis: Secondary | ICD-10-CM

## 2023-07-10 DIAGNOSIS — I471 Supraventricular tachycardia, unspecified: Secondary | ICD-10-CM | POA: Diagnosis not present

## 2023-07-10 DIAGNOSIS — I25118 Atherosclerotic heart disease of native coronary artery with other forms of angina pectoris: Secondary | ICD-10-CM | POA: Diagnosis not present

## 2023-07-10 DIAGNOSIS — E785 Hyperlipidemia, unspecified: Secondary | ICD-10-CM | POA: Diagnosis not present

## 2023-07-10 MED ORDER — ROSUVASTATIN CALCIUM 5 MG PO TABS
5.0000 mg | ORAL_TABLET | ORAL | 3 refills | Status: DC
Start: 2023-07-10 — End: 2024-03-27

## 2023-07-10 NOTE — Patient Instructions (Signed)
Medication Instructions:  Your physician recommends that you continue on your current medications as directed. Please refer to the Current Medication list given to you today.  *If you need a refill on your cardiac medications before your next appointment, please call your pharmacy*   Lab Work: None ordered  If you have labs (blood work) drawn today and your tests are completely normal, you will receive your results only by: MyChart Message (if you have MyChart) OR A paper copy in the mail If you have any lab test that is abnormal or we need to change your treatment, we will call you to review the results.   Testing/Procedures: None ordered    Follow-Up: At [facility], you and your health needs are our priority.  As part of our continuing mission to provide you with exceptional heart care, we have created designated Provider Care Teams.  These Care Teams include your primary Cardiologist (physician) and Advanced Practice Providers (APPs -  Physician Assistants and Nurse Practitioners) who all work together to provide you with the care you need, when you need it.  We recommend signing up for the patient portal called "MyChart".  Sign up information is provided on this After Visit Summary.  MyChart is used to connect with patients for Virtual Visits (Telemedicine).  Patients are able to view lab/test results, encounter notes, upcoming appointments, etc.  Non-urgent messages can be sent to your provider as well.   To learn more about what you can do with MyChart, go to [email or web address].    Your next appointment:   6 month(s)  Provider:   Jodelle Red, MD or Gillian Shields, NP    Other Instructions

## 2023-07-10 NOTE — Progress Notes (Signed)
Cardiology Office Note:  .   Date:  07/10/2023  ID:  Zachary Lara, DOB 11/28/1960, MRN [id number] PCP: Jackelyn Poling, DO  [facility] Providers Cardiologist:  Nanetta Batty, MD    History of Present Illness: .   Zachary Lara is a 62 y.o. male  with a hx of pericarditis s/p radical pericardiectomy at [facility] in [place] 2013 with subsequent VATS and pleurodesis, GERD. Family history notable for coronary disease.    Previously followed at [facility] in [place]. 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 Soon after had VATS with pleurodesis because of recurrent pleural effusion. CT chest [facility] 06/2020 unremarkable. Echo EF 40-45%, normal valvular function. Established with Dr. Allyson Sabal 09/01/20. Echo 05/28/21 normal LVEF 55-60%, no RWMA, normal diastolic parameters, of thre efindings for constrictive physiology - only one noted, no significant valvular abnormalities.    ED visit 01/09/22 recovering from [employer] syndrome following treatment of left leg abscess. EKG NSR with PVC's.  CXR, CT chest reassuring. Seen 01/13/22 in follow up with labile BP and heart rate. ZIO revealed predominantly NSR but did have 44 runs of SVT as well as PVCs.Recommended for Toprol 25mg  daily.  At follow up 03/2022 noted fatigue on Toprol and was started on Diltiazem. Toprol later resumed at lower dose of 12.5mg  daily.    Cardiac CTA due to chest discomfort 08/03/2022 coronary calcium score of 0 but did note several focal pericardial calcifications.  Sed rate and CRP were normal. There was noncalcified plaque noted so Rosuvastatin initiated.   At visit 04/07/23 due to intermittent discomfort, low dose Colchicine (Lodoco) 0.5mg  daily initiated.     He presents today for follow up. Reports no recent paliptations. Has been following a heart healthy diet. Reports palpitations have been well controlled on present doses of Metoprolol, Diltiazem. Requesting note to  excuse him from 24h coverage at work as SVT/PVC/chest pain have previously been worsened by lack of sleep and additionally he takes trazadone. Did not note improvement with Colchicine and actually noted more myalgias. Has not been taking with no recurrent chest discomfort.   ROS: Please see the history of present illness.    All other systems reviewed and are negative.   Studies Reviewed: .        Cardiac Studies & Procedures     STRESS TESTS  MYOCARDIAL PERFUSION IMAGING 10/20/2003  ECHOCARDIOGRAM  ECHOCARDIOGRAM COMPLETE 05/28/2021  Narrative ECHOCARDIOGRAM REPORT    Patient Name:   Zachary Lara Date of Exam: 05/28/2021 Medical Rec #:  [id number]        Height:       69.0 in Accession #:    [id number]       Weight:       164.0 lb Date of Birth:  Jan 12, 1961        BSA:          1.899 m Patient Age:    60 years         BP:           112/70 mmHg Patient Gender: M                HR:           64 bpm. 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 Chronic constrictive idiopathic pericarditis; Risk Factors:Dyslipidemia.  Sonographer:    Samule Ohm RDCS Referring Phys: [phone] JONATHAN J BERRY  IMPRESSIONS   1. Left ventricular ejection fraction, by estimation, is 55 to 60%. The left ventricle has normal function. The left ventricle has no regional wall motion abnormalities. Left ventricular diastolic parameters were normal. There is an interventricular septal bounce without an annulus reversus. No IVC plethora, IVC spectral Doppler not performed for hepatic diastolic flow reversal ratio. Of the three findings assessed for constrictive physiology, only one is seen. 2. Right ventricular systolic function is normal. The right ventricular size is normal. Tricuspid  regurgitation signal is inadequate for assessing PA pressure. 3. The mitral valve is normal in structure. No evidence of mitral valve regurgitation. No evidence of mitral stenosis. 4. The aortic valve is tricuspid. Aortic valve regurgitation is not visualized. No aortic stenosis is present. 5. The inferior vena cava is normal in size with greater than 50% respiratory variability, suggesting right atrial pressure of 3 mmHg.  Comparison(s): Unable to access 2008 study or report.  FINDINGS Left Ventricle: Left ventricular ejection fraction, by estimation, is 55 to 60%. The left ventricle has normal function. The left ventricle has no regional wall motion abnormalities. The left ventricular internal cavity size was normal in size. There is no left ventricular hypertrophy. Left ventricular diastolic parameters were normal.  Right Ventricle: The right ventricular size is normal. No increase in right ventricular wall thickness. Right ventricular systolic function is normal. Tricuspid regurgitation signal is inadequate for assessing PA pressure.  Left Atrium: Left atrial size was normal in size.  Right Atrium: Right atrial size was normal in size.  Pericardium: There is no evidence of pericardial effusion.  Mitral Valve: The mitral valve is normal in structure. No evidence of mitral valve regurgitation. No evidence of mitral valve stenosis.  Tricuspid Valve: The tricuspid valve is normal in structure. Tricuspid valve regurgitation is trivial. No evidence of tricuspid stenosis.  Aortic Valve: The aortic valve is tricuspid. Aortic valve regurgitation is not visualized. No aortic stenosis is present.  Pulmonic Valve: The pulmonic valve was normal in structure. Pulmonic valve regurgitation is not visualized. No evidence of pulmonic stenosis.  Aorta: The aortic root and ascending aorta are structurally normal, with no evidence of dilitation.  Venous: The inferior vena cava is normal in size with  greater than 50% respiratory variability, suggesting right atrial pressure of 3 mmHg.  IAS/Shunts: The atrial septum is grossly normal.   LEFT VENTRICLE PLAX 2D LVIDd:         4.20 cm   Diastology LVIDs:         3.10 cm   LV e' medial:    10.40 cm/s LV PW:         1.20 cm   LV E/e' medial:  6.7 LV IVS:        1.00 cm   LV e' lateral:   14.50 cm/s LVOT diam:     2.20 cm   LV E/e' lateral: 4.8 LV SV:         60 LV SV Index:   32 LVOT Area:     3.80 cm   RIGHT VENTRICLE             IVC RV S prime:     10.70 cm/s  IVC diam: 1.40 cm TAPSE (M-mode): 1.3 cm RVSP:           26.4 mmHg  LEFT ATRIUM  Index        RIGHT ATRIUM           Index LA diam:        4.10 cm 2.16 cm/m   RA Pressure: 3.00 mmHg LA Vol (A2C):   50.5 ml 26.59 ml/m  RA Area:     12.40 cm LA Vol (A4C):   49.9 ml 26.28 ml/m  RA Volume:   28.00 ml  14.75 ml/m LA Biplane Vol: 53.9 ml 28.38 ml/m AORTIC VALVE LVOT Vmax:   87.80 cm/s LVOT Vmean:  57.900 cm/s LVOT VTI:    0.159 m  AORTA Ao Root diam: 3.70 cm Ao Asc diam:  3.30 cm  MITRAL VALVE               TRICUSPID VALVE MV Area (PHT): 2.86 cm    TR Peak grad:   23.4 mmHg MV Decel Time: 265 msec    TR Vmax:        242.00 cm/s MV E velocity: 69.20 cm/s  Estimated RAP:  3.00 mmHg MV A velocity: 71.80 cm/s  RVSP:           26.4 mmHg MV E/A ratio:  0.96 SHUNTS Systemic VTI:  0.16 m Systemic Diam: 2.20 cm  Riley Lam MD Electronically signed by Riley Lam MD Signature Date/Time: 05/28/2021/6:48:47 PM    Final   MONITORS  LONG TERM MONITOR (3-14 DAYS) 02/04/2022  Narrative Patch Wear Time:  13 days and 10 hours (2023-07-06T09:08:45-0400 to 2023-07-19T20:04:05-399)  Patient had a min HR of 54 bpm, max HR of 160 bpm, and avg HR of 83 bpm. Predominant underlying rhythm was Sinus Rhythm. 44 Supraventricular Tachycardia runs occurred, the run with the fastest interval lasting 7 beats with a max rate of 160 bpm, the longest  lasting 36.2 secs with an avg rate of 120 bpm. Isolated SVEs were rare (<1.0%), SVE Couplets were rare (<1.0%), and SVE Triplets were rare (<1.0%). Isolated VEs were rare (<1.0%, 12567), VE Couplets were rare (<1.0%, 121), and VE Triplets were rare (<1.0%, 11). Ventricular Bigeminy and Trigeminy were present.  SR/SB/ST Freq PVCs Runs of SVT Needs ROV to discuss  CT SCANS  CT CORONARY MORPH W/CTA COR W/SCORE 08/03/2022  Addendum 08/04/2022 10:22 AM ADDENDUM REPORT: 08/04/2022 10:19  EXAM: OVER-READ INTERPRETATION  CT CHEST  The following report is an over-read performed by radiologist Dr. Noe [facility] on 08/04/2022. This over-read does not include interpretation of cardiac or coronary anatomy or pathology. The coronary CTA interpretation by the cardiologist is attached.  COMPARISON:  01/09/2022  FINDINGS: Heart is normal size. Aorta normal caliber. Pericardial calcifications again noted, unchanged. No adenopathy. No confluent airspace opacities. Linear scarring at the left lung base, unchanged. No acute findings in the upper abdomen. Chest wall soft tissues are unremarkable. No acute bony abnormality.  IMPRESSION: No acute extra cardiac abnormality.  Scattered pericardial calcifications, stable.  Left basilar scarring.   Electronically Signed By: Charlett Nose M.D. On: 08/04/2022 10:19  Narrative CLINICAL DATA:  Chest pain  EXAM: Cardiac/Coronary CTA  TECHNIQUE: A non-contrast, gated CT scan was obtained with axial slices of 3 mm through the heart for calcium scoring. Calcium scoring was performed using the Agatston method. A 120 kV prospective, gated, contrast cardiac scan was obtained. Gantry rotation speed was 250 msecs and collimation was 0.6 mm. Two sublingual nitroglycerin tablets (0.8 mg) were given. The 3D data set was reconstructed in 5% intervals of the 35-75% of the R-R cycle. Diastolic phases  were analyzed on a dedicated  workstation using MPR, MIP, and VRT modes. The patient received 95 cc of contrast.  FINDINGS: Image quality: Excellent.  Noise artifact is: Limited.  Coronary Arteries:  Normal coronary origin.  Right dominance.  Left main: The left main is a large caliber vessel with a normal take off from the left coronary cusp that bifurcates to form a left anterior descending artery and a left circumflex artery. There is no plaque or stenosis.  Left anterior descending artery: The LAD is patent without evidence of plaque or stenosis. The LAD gives off 2 patent diagonal branches.  Left circumflex artery: The LCX is non-dominant and patent with no evidence of plaque or stenosis. The LCX gives off 1 patent obtuse marginal branch.  Right coronary artery: The RCA is dominant with normal take off from the right coronary cusp. There is no evidence of plaque or stenosis. The RCA terminates as a PDA and right posterolateral branch without evidence of plaque or stenosis.  Right Atrium: Right atrial size is within normal limits.  Right Ventricle: The right ventricular cavity is within normal limits.  Left Atrium: Left atrial size is normal in size with no left atrial appendage filling defect. Small PFO.  Left Ventricle: The ventricular cavity size is within normal limits.  Pulmonary arteries: Normal in size without proximal filling defect.  Pulmonary veins: Normal pulmonary venous drainage.  Pericardium: Normal thickness. Several focal pericardial calcifications noted.  Cardiac valves: The aortic valve is trileaflet without significant calcification. The mitral valve is normal without significant calcification.  Aorta: Mild dilation of the aortic root up to 39 mm. Ascending aorta is normal caliber.  Extra-cardiac findings: See attached radiology report for non-cardiac structures.  IMPRESSION: 1. Coronary calcium score of 0.  2. Normal coronary origin with right dominance.  3.  Normal coronary arteries.  4. Several focal pericardial calcifications noted. Would consider work-up for pericarditis.  RECOMMENDATIONS: 1. No evidence of CAD (0%). Consider non-atherosclerotic causes of chest pain.  Lennie Odor, MD  Electronically Signed: By: Lennie Odor M.D. On: 08/03/2022 20:57          Risk Assessment/Calculations:             Physical Exam:   VS:  BP 124/60   Pulse 73   Ht 5\' 10"  (1.778 m)   Wt 174 lb 4.8 oz (79.1 kg)   SpO2 96%   BMI 25.01 kg/m    Wt Readings from Last 3 Encounters:  07/10/23 174 lb 4.8 oz (79.1 kg)  04/07/23 169 lb 12.8 oz (77 kg)  10/11/22 167 lb (75.8 kg)    GEN: Well nourished, well developed in no acute distress NECK: No JVD; No carotid bruits CARDIAC: RRR, no murmurs, rubs, gallops RESPIRATORY:  Clear to auscultation without rales, wheezing or rhonchi  ABDOMEN: Soft, non-tender, non-distended EXTREMITIES:  No edema; No deformity   ASSESSMENT AND PLAN: .    Chest pressure / minimal nonobstructive CAD / HLD, LDL goal <70 - Cardiac CTA 07/2022 with coronary calcium score of 0 but LAD score of 4 mm3 calcified plaque and 20 mm3 non calcified plaque by [employer] Registry. Minimal nonobstructive CAD. Continue rosuvastatin 5mg  daily. Recommend aiming for 150 minutes of moderate intensity activity per week and following a heart healthy diet. 04/2023 LDL 33.    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 Monitor 01/2022 with 44 runs of SVT fastest 7 beats rate 160 bpm, longest 36.2 seconds 120 bpm.  Echo November  2022 no significant valve normalities. Palpitations have been quiescent on Diltiazem 120mg  daily and metoprolol succinate 12.5mg  daily, continue same.    Constrictive pericarditis s/p radical pericardectomy 2013 - CT 08/03/22 with several focal pericardial calcifications. Labs 07/22/22 CRP, sed rate normal. No recent chest discomfort. Trialed Lodoco (colchicine) 0.5mg  but did not note much improvement and had more muscle  discomfort in his legs, will not resume.        Dispo: Follow-up in clinic in 6 months  Signed, Alver Sorrow, NP

## 2023-07-28 DIAGNOSIS — J209 Acute bronchitis, unspecified: Secondary | ICD-10-CM | POA: Diagnosis not present

## 2023-09-29 DIAGNOSIS — L84 Corns and callosities: Secondary | ICD-10-CM | POA: Diagnosis not present

## 2023-09-29 DIAGNOSIS — J209 Acute bronchitis, unspecified: Secondary | ICD-10-CM | POA: Diagnosis not present

## 2023-09-29 DIAGNOSIS — R051 Acute cough: Secondary | ICD-10-CM | POA: Diagnosis not present

## 2023-10-16 ENCOUNTER — Encounter: Payer: Self-pay | Admitting: Podiatry

## 2023-10-16 ENCOUNTER — Ambulatory Visit (INDEPENDENT_AMBULATORY_CARE_PROVIDER_SITE_OTHER): Admitting: Podiatry

## 2023-10-16 DIAGNOSIS — D2371 Other benign neoplasm of skin of right lower limb, including hip: Secondary | ICD-10-CM

## 2023-10-16 DIAGNOSIS — D2372 Other benign neoplasm of skin of left lower limb, including hip: Secondary | ICD-10-CM

## 2023-10-16 NOTE — Progress Notes (Signed)
  Subjective:  Patient ID: Zachary Lara, male    DOB: 02-14-1961,   MRN: [id number]  No chief complaint on file.   63 y.o. male presents for concern of multiple lesions on the bottom of both of his feet that have been very painful. Relates he normally pops them out and is good for a while but one on the right has been difficutl and very painful . Denies any other pedal complaints. Denies n/v/f/c.   Past Medical History:  Diagnosis Date   Arthritis    Dizziness    SINCE TAKING FLUID PILL   Granulomatous hepatitis    H/O hiatal hernia    Hemorrhoids    Pericarditis    H/O, chronic calcific,normal stress cardiolite  10/20/2003 EF 52%   Pulmonary edema    CURRENTLY HAS ABNORMAL CXR - FOLLOWED BY CARDIOLOGIST AT [facility]   PVC (premature ventricular contraction)    Shortness of breath    WITH EXERTION -    SVT (supraventricular tachycardia) (HCC)    Ulcer    NON-BLEEDING STOMACH ULCER   Ventricular fibrillation (HCC)    POST OP PARICARDECTOMY - NO EPISODES SINCE    Objective:  Physical Exam: Vascular: DP/PT pulses 2/4 bilateral. CFT <3 seconds. Normal hair growth on digits. No edema.  Skin. No lacerations or abrasions bilateral feet. Hyperkeratotic cored lesions noted sub second and and third metatarsal head and neck on left  and sub fifth metatarsal head and styloid process on right with disruption of skin lines.  Musculoskeletal: MMT 5/5 bilateral lower extremities in DF, PF, Inversion and Eversion. Deceased ROM in DF of ankle joint.  Neurological: Sensation intact to light touch.   Assessment:   1. Benign neoplasm of skin of foot, left   2. Benign neoplasm of skin of right foot      Plan:  Patient was evaluated and treated and all questions answered. -Discussed benign skin lesions with patient and treatment options.  -Hyperkeratotic tissue was debrided with chisel without incident.  -Applied salycylic acid treatment to area with dressing. Advised to remove bandaging  tomorrow.  -Encouraged daily moisturizing -Discussed use of pumice stone -Advised good supportive shoes and inserts -Patient to return to office as needed or sooner if condition worsens.   Louann Sjogren, DPM

## 2023-11-04 ENCOUNTER — Other Ambulatory Visit (HOSPITAL_BASED_OUTPATIENT_CLINIC_OR_DEPARTMENT_OTHER): Payer: Self-pay | Admitting: Family

## 2023-11-04 DIAGNOSIS — I471 Supraventricular tachycardia, unspecified: Secondary | ICD-10-CM

## 2023-11-04 DIAGNOSIS — I493 Ventricular premature depolarization: Secondary | ICD-10-CM

## 2024-01-13 ENCOUNTER — Other Ambulatory Visit (HOSPITAL_BASED_OUTPATIENT_CLINIC_OR_DEPARTMENT_OTHER): Payer: Self-pay | Admitting: Family

## 2024-01-15 ENCOUNTER — Encounter (HOSPITAL_BASED_OUTPATIENT_CLINIC_OR_DEPARTMENT_OTHER): Payer: Self-pay

## 2024-01-15 DIAGNOSIS — R079 Chest pain, unspecified: Secondary | ICD-10-CM

## 2024-01-16 NOTE — Telephone Encounter (Signed)
  If feeling tired and needing to lay down different than previous pericarditis, would recommend discuss with primary care as well. Overall atypical for cardiac origin.   His Rosuvastatin  helps to prevent progression of plaque build up in coronary arteries. The Lodoco  (colchicine ) is moreso for reducing inflammation and reducing cardiovascular risk. Not certain this would improve his symptoms.   For further evaluation, would recommend CBC, BMET, CRP, sed rate.   We can schedule him 02/19/24 at 2:20pm with me (the patient in the slot prior does not require prolonged appointment time). If that does not work, will have to be seen at [place] due to limited availability.   Jarquez Mestre S Jontay Maston, NP

## 2024-01-16 NOTE — Telephone Encounter (Signed)
 Soonest appointment is at [facility] and is 8/25- do you want to review things and advise?

## 2024-01-20 ENCOUNTER — Ambulatory Visit (HOSPITAL_BASED_OUTPATIENT_CLINIC_OR_DEPARTMENT_OTHER): Payer: Self-pay | Admitting: Family

## 2024-01-20 LAB — CBC
Hematocrit: 42.2 % (ref 37.5–51.0)
Hemoglobin: 14.5 g/dL (ref 13.0–17.7)
MCH: 33.5 pg — ABNORMAL HIGH (ref 26.6–33.0)
MCHC: 34.4 g/dL (ref 31.5–35.7)
MCV: 98 fL — ABNORMAL HIGH (ref 79–97)
Platelets: 140 x10E3/uL — ABNORMAL LOW (ref 150–450)
RBC: 4.33 x10E6/uL (ref 4.14–5.80)
RDW: 12.5 % (ref 11.6–15.4)
WBC: 4.5 x10E3/uL (ref 3.4–10.8)

## 2024-01-20 LAB — BASIC METABOLIC PANEL WITH GFR
BUN/Creatinine Ratio: 13 (ref 10–24)
BUN: 14 mg/dL (ref 8–27)
CO2: 23 mmol/L (ref 20–29)
Calcium: 8.7 mg/dL (ref 8.6–10.2)
Chloride: 99 mmol/L (ref 96–106)
Creatinine, Ser: 1.05 mg/dL (ref 0.76–1.27)
Glucose: 78 mg/dL (ref 70–99)
Potassium: 4.3 mmol/L (ref 3.5–5.2)
Sodium: 136 mmol/L (ref 134–144)
eGFR: 79 mL/min/1.73 (ref >59)

## 2024-01-20 LAB — SEDIMENTATION RATE: Sed Rate: 7 mm/h (ref 0–30)

## 2024-01-20 LAB — C-REACTIVE PROTEIN: CRP: 6 mg/L (ref 0–10)

## 2024-02-19 ENCOUNTER — Ambulatory Visit (HOSPITAL_BASED_OUTPATIENT_CLINIC_OR_DEPARTMENT_OTHER): Admitting: Family

## 2024-02-19 ENCOUNTER — Encounter (HOSPITAL_BASED_OUTPATIENT_CLINIC_OR_DEPARTMENT_OTHER): Payer: Self-pay | Admitting: Family

## 2024-02-19 VITALS — BP 117/73 | HR 66 | Ht 69.0 in | Wt 171.4 lb

## 2024-02-19 DIAGNOSIS — I25118 Atherosclerotic heart disease of native coronary artery with other forms of angina pectoris: Secondary | ICD-10-CM

## 2024-02-19 DIAGNOSIS — I493 Ventricular premature depolarization: Secondary | ICD-10-CM

## 2024-02-19 DIAGNOSIS — I311 Chronic constrictive pericarditis: Secondary | ICD-10-CM

## 2024-02-19 DIAGNOSIS — I471 Supraventricular tachycardia, unspecified: Secondary | ICD-10-CM

## 2024-02-19 LAB — HEMOGLOBIN AND HEMATOCRIT, BLOOD
Hematocrit: 41.7 % (ref 37.5–51.0)
Hemoglobin: 15.6 g/dL (ref 13.0–17.7)

## 2024-02-19 MED ORDER — METOPROLOL SUCCINATE ER 25 MG PO TB24: 12.5000 mg | ORAL_TABLET | Freq: Every day | ORAL | 1 refills | Status: AC

## 2024-02-19 NOTE — Progress Notes (Signed)
 Cardiology Office Note:  .   Date:  02/19/2024  ID:  Zachary Lara, DOB February 03, 1961, MRN [id number] PCP: Dayna Motto, DO  [facility] Providers Cardiologist:  Dorn Lesches, MD    History of Present Illness: .   Zachary Lara is a 63 y.o. male  with a hx of pericarditis s/p radical pericardiectomy at [facility] in [place] 2013 with subsequent VATS and pleurodesis, GERD. Family history notable for coronary disease.    Previously followed at [facility] in [place]. 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 Soon after had VATS with pleurodesis because of recurrent pleural effusion. CT chest [facility] 06/2020 unremarkable. Echo EF 40-45%, normal valvular function. Established with Dr. Lesches 09/01/20. Echo 05/28/21 normal LVEF 55-60%, no RWMA, normal diastolic parameters, of thre efindings for constrictive physiology - only one noted, no significant valvular abnormalities.    ED visit 01/09/22 recovering from [employer] syndrome following treatment of left leg abscess. EKG NSR with PVC's.  CXR, CT chest reassuring. Seen 01/13/22 in follow up with labile BP and heart rate. ZIO revealed predominantly NSR but did have 44 runs of SVT as well as PVCs.Recommended for Toprol  25mg  daily.  At follow up 03/2022 noted fatigue on Toprol  and was started on Diltiazem . Toprol  later resumed at lower dose of 12.5mg  daily.    Cardiac CTA due to chest discomfort 08/03/2022 coronary calcium  score of 0 but did note several focal pericardial calcifications.  Sed rate and CRP were normal. There was noncalcified plaque noted so Rosuvastatin  initiated.   At visit 04/07/23 due to intermittent discomfort, low dose Colchicine  (Lodoco ) 0.5mg  daily initiated. However, was not well covered by insurance and discontinued.   He contacted the office 01/15/24 nothing recurrent chest pain. Subsequent sed rate and CRP were normal.     He presents today for follow up. Reports no recent  paliptations. Continues to follow a heart healthy diet and stays active exercising with a combination of aerobic exercise and strength training. He notes ches tpain is intermittent with no clear pattern. It occurs in his left chest and describes as if someone is pressing a fist against his chest. Not sharp or severe, but more sore however not tender on palpation. It is at times associated with exertional dyspnea.   ROS: Please see the history of present illness.    All other systems reviewed and are negative.   Studies Reviewed: Zachary Lara   EKG Interpretation Date/Time:  Monday February 19 2024 14:20:43 EDT Ventricular Rate:  76 PR Interval:  242 QRS Duration:  90 QT Interval:  394 QTC Calculation: 443 R Axis:   65  Text Interpretation: Sinus rhythm with 1st degree A-V block Nonspecific T wave abnormality Confirmed by Vannie Mora ([id number]) on 02/19/2024 2:36:07 PM       Risk Assessment/Calculations:     Cardiac Studies & Procedures   ______________________________________________________________________________________________   STRESS TESTS  MYOCARDIAL PERFUSION IMAGING 10/20/2003   ECHOCARDIOGRAM  ECHOCARDIOGRAM COMPLETE 05/28/2021  Narrative ECHOCARDIOGRAM REPORT    Patient Name:   Zachary Lara Date of Exam: 05/28/2021 Medical Rec #:  [id number]        Height:       69.0 in Accession #:    [id number]       Weight:       164.0 lb Date of Birth:  08-09-1960        BSA:          1.899 m Patient Age:  60 years         BP:           112/70 mmHg Patient Gender: M                HR:           64 bpm. 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 Chronic constrictive idiopathic pericarditis; Risk Factors:Dyslipidemia.  Sonographer:    Elsie Bohr RDCS Referring Phys:  [phone] JONATHAN J BERRY  IMPRESSIONS   1. Left ventricular ejection fraction, by estimation, is 55 to 60%. The left ventricle has normal function. The left ventricle has no regional wall motion abnormalities. Left ventricular diastolic parameters were normal. There is an interventricular septal bounce without an annulus reversus. No IVC plethora, IVC spectral Doppler not performed for hepatic diastolic flow reversal ratio. Of the three findings assessed for constrictive physiology, only one is seen. 2. Right ventricular systolic function is normal. The right ventricular size is normal. Tricuspid regurgitation signal is inadequate for assessing PA pressure. 3. The mitral valve is normal in structure. No evidence of mitral valve regurgitation. No evidence of mitral stenosis. 4. The aortic valve is tricuspid. Aortic valve regurgitation is not visualized. No aortic stenosis is present. 5. The inferior vena cava is normal in size with greater than 50% respiratory variability, suggesting right atrial pressure of 3 mmHg.  Comparison(s): Unable to access 2008 study or report.  FINDINGS Left Ventricle: Left ventricular ejection fraction, by estimation, is 55 to 60%. The left ventricle has normal function. The left ventricle has no regional wall motion abnormalities. The left ventricular internal cavity size was normal in size. There is no left ventricular hypertrophy. Left ventricular diastolic parameters were normal.  Right Ventricle: The right ventricular size is normal. No increase in right ventricular wall thickness. Right ventricular systolic function is normal. Tricuspid regurgitation signal is inadequate for assessing PA pressure.  Left Atrium: Left atrial size was normal in size.  Right Atrium: Right atrial size was normal in size.  Pericardium: There is no evidence of pericardial effusion.  Mitral Valve: The mitral valve is normal in structure. No evidence of mitral valve regurgitation. No  evidence of mitral valve stenosis.  Tricuspid Valve: The tricuspid valve is normal in structure. Tricuspid valve regurgitation is trivial. No evidence of tricuspid stenosis.  Aortic Valve: The aortic valve is tricuspid. Aortic valve regurgitation is not visualized. No aortic stenosis is present.  Pulmonic Valve: The pulmonic valve was normal in structure. Pulmonic valve regurgitation is not visualized. No evidence of pulmonic stenosis.  Aorta: The aortic root and ascending aorta are structurally normal, with no evidence of dilitation.  Venous: The inferior vena cava is normal in size with greater than 50% respiratory variability, suggesting right atrial pressure of 3 mmHg.  IAS/Shunts: The atrial septum is grossly normal.   LEFT VENTRICLE PLAX 2D LVIDd:         4.20 cm   Diastology LVIDs:         3.10 cm   LV e' medial:    10.40 cm/s LV PW:         1.20 cm   LV E/e' medial:  6.7 LV IVS:        1.00 cm   LV e' lateral:   14.50 cm/s  LVOT diam:     2.20 cm   LV E/e' lateral: 4.8 LV SV:         60 LV SV Index:   32 LVOT Area:     3.80 cm   RIGHT VENTRICLE             IVC RV S prime:     10.70 cm/s  IVC diam: 1.40 cm TAPSE (M-mode): 1.3 cm RVSP:           26.4 mmHg  LEFT ATRIUM             Index        RIGHT ATRIUM           Index LA diam:        4.10 cm 2.16 cm/m   RA Pressure: 3.00 mmHg LA Vol (A2C):   50.5 ml 26.59 ml/m  RA Area:     12.40 cm LA Vol (A4C):   49.9 ml 26.28 ml/m  RA Volume:   28.00 ml  14.75 ml/m LA Biplane Vol: 53.9 ml 28.38 ml/m AORTIC VALVE LVOT Vmax:   87.80 cm/s LVOT Vmean:  57.900 cm/s LVOT VTI:    0.159 m  AORTA Ao Root diam: 3.70 cm Ao Asc diam:  3.30 cm  MITRAL VALVE               TRICUSPID VALVE MV Area (PHT): 2.86 cm    TR Peak grad:   23.4 mmHg MV Decel Time: 265 msec    TR Vmax:        242.00 cm/s MV E velocity: 69.20 cm/s  Estimated RAP:  3.00 mmHg MV A velocity: 71.80 cm/s  RVSP:           26.4 mmHg MV E/A ratio:   0.96 SHUNTS Systemic VTI:  0.16 m Systemic Diam: 2.20 cm  Stanly Leavens MD Electronically signed by Stanly Leavens MD Signature Date/Time: 05/28/2021/6:48:47 PM    Final    MONITORS  LONG TERM MONITOR (3-14 DAYS) 02/04/2022  Narrative Patch Wear Time:  13 days and 10 hours (2023-07-06T09:08:45-0400 to 2023-07-19T20:04:05-399)  Patient had a min HR of 54 bpm, max HR of 160 bpm, and avg HR of 83 bpm. Predominant underlying rhythm was Sinus Rhythm. 44 Supraventricular Tachycardia runs occurred, the run with the fastest interval lasting 7 beats with a max rate of 160 bpm, the longest lasting 36.2 secs with an avg rate of 120 bpm. Isolated SVEs were rare (<1.0%), SVE Couplets were rare (<1.0%), and SVE Triplets were rare (<1.0%). Isolated VEs were rare (<1.0%, 12567), VE Couplets were rare (<1.0%, 121), and VE Triplets were rare (<1.0%, 11). Ventricular Bigeminy and Trigeminy were present.  SR/SB/ST Freq PVCs Runs of SVT Needs ROV to discuss   CT SCANS  CT CORONARY MORPH W/CTA COR W/SCORE 08/03/2022  Addendum 08/04/2022 10:22 AM ADDENDUM REPORT: 08/04/2022 10:19  EXAM: OVER-READ INTERPRETATION  CT CHEST  The following report is an over-read performed by radiologist Dr. Franky Leff [facility] on 08/04/2022. This over-read does not include interpretation of cardiac or coronary anatomy or pathology. The coronary CTA interpretation by the cardiologist is attached.  COMPARISON:  01/09/2022  FINDINGS: Heart is normal size. Aorta normal caliber. Pericardial calcifications again noted, unchanged. No adenopathy. No confluent airspace opacities. Linear scarring at the left lung base, unchanged. No acute findings in the upper abdomen. Chest wall soft tissues are unremarkable. No acute bony abnormality.  IMPRESSION: No acute extra cardiac abnormality.  Scattered pericardial calcifications,  stable.  Left basilar scarring.   Electronically  Signed By: Franky Crease M.D. On: 08/04/2022 10:19  Narrative CLINICAL DATA:  Chest pain  EXAM: Cardiac/Coronary CTA  TECHNIQUE: A non-contrast, gated CT scan was obtained with axial slices of 3 mm through the heart for calcium  scoring. Calcium  scoring was performed using the Agatston method. A 120 kV prospective, gated, contrast cardiac scan was obtained. Gantry rotation speed was 250 msecs and collimation was 0.6 mm. Two sublingual nitroglycerin  tablets (0.8 mg) were given. The 3D data set was reconstructed in 5% intervals of the 35-75% of the R-R cycle. Diastolic phases were analyzed on a dedicated workstation using MPR, MIP, and VRT modes. The patient received 95 cc of contrast.  FINDINGS: Image quality: Excellent.  Noise artifact is: Limited.  Coronary Arteries:  Normal coronary origin.  Right dominance.  Left main: The left main is a large caliber vessel with a normal take off from the left coronary cusp that bifurcates to form a left anterior descending artery and a left circumflex artery. There is no plaque or stenosis.  Left anterior descending artery: The LAD is patent without evidence of plaque or stenosis. The LAD gives off 2 patent diagonal branches.  Left circumflex artery: The LCX is non-dominant and patent with no evidence of plaque or stenosis. The LCX gives off 1 patent obtuse marginal branch.  Right coronary artery: The RCA is dominant with normal take off from the right coronary cusp. There is no evidence of plaque or stenosis. The RCA terminates as a PDA and right posterolateral branch without evidence of plaque or stenosis.  Right Atrium: Right atrial size is within normal limits.  Right Ventricle: The right ventricular cavity is within normal limits.  Left Atrium: Left atrial size is normal in size with no left atrial appendage filling defect. Small PFO.  Left Ventricle: The ventricular cavity size is within normal limits.  Pulmonary  arteries: Normal in size without proximal filling defect.  Pulmonary veins: Normal pulmonary venous drainage.  Pericardium: Normal thickness. Several focal pericardial calcifications noted.  Cardiac valves: The aortic valve is trileaflet without significant calcification. The mitral valve is normal without significant calcification.  Aorta: Mild dilation of the aortic root up to 39 mm. Ascending aorta is normal caliber.  Extra-cardiac findings: See attached radiology report for non-cardiac structures.  IMPRESSION: 1. Coronary calcium  score of 0.  2. Normal coronary origin with right dominance.  3. Normal coronary arteries.  4. Several focal pericardial calcifications noted. Would consider work-up for pericarditis.  RECOMMENDATIONS: 1. No evidence of CAD (0%). Consider non-atherosclerotic causes of chest pain.  Darryle Decent, MD  Electronically Signed: By: Darryle Decent M.D. On: 08/03/2022 20:57     ______________________________________________________________________________________________              Physical Exam:   VS:  BP 117/73   Pulse 66   Ht 5' 9 (1.753 m)   Wt 171 lb 6.4 oz (77.7 kg)   SpO2 98%   BMI 25.31 kg/m    Wt Readings from Last 3 Encounters:  02/19/24 171 lb 6.4 oz (77.7 kg)  07/10/23 174 lb 4.8 oz (79.1 kg)  04/07/23 169 lb 12.8 oz (77 kg)    GEN: Well nourished, well developed in no acute distress NECK: No JVD; No carotid bruits CARDIAC: RRR, no murmurs, rubs, gallops RESPIRATORY:  Clear to auscultation without rales, wheezing or rhonchi  ABDOMEN: Soft, non-tender, non-distended EXTREMITIES:  No edema; No deformity   ASSESSMENT AND PLAN: .  Chest pressure / minimal nonobstructive CAD / HLD, LDL goal <70 - Cardiac CTA 07/2022 with coronary calcium  score of 0 but LAD score of 4 mm3 calcified plaque and 20 mm3 non calcified plaque by DECIDE Registry. Minimal nonobstructive CAD. Continue rosuvastatin  5mg  daily. Recommend aiming for  150 minutes of moderate intensity activity per week and following a heart healthy diet. 04/2023 LDL 33.    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 Monitor 01/2022 with 44 runs of SVT fastest 7 beats rate 160 bpm, longest 36.2 seconds 120 bpm.  Echo November 2022 no significant valve normalities. Palpitations have been quiescent on Diltiazem  120mg  daily and metoprolol  succinate 12.5mg  daily, continue same.    Constrictive pericarditis s/p radical pericardectomy 2013 - CT 08/03/22 with several focal pericardial calcifications. Labs 07/22/22 and 01/2024 CRP, sed rate normal. Notes recurrent L chest wall chest discomfort. Cardiac MRI fur further evaluation. Discussed addition of Colchicine  0.6mg  BID to see if improvement in symptoms, he prefers to await cMRI results .Zachary Lara Consider Arcalyst pending MRI findings.     Dispo: Follow-up in clinic in 2-3 months  Signed, Reche GORMAN Finder, NP

## 2024-02-19 NOTE — Patient Instructions (Addendum)
 Medication Instructions:  Continue your current medications.   *If you need a refill on your cardiac medications before your next appointment, please call your pharmacy*  Lab Work: Your physician recommends that you return for lab work today: CBC If you have labs (blood work) drawn today and your tests are completely normal, you will receive your results only by: MyChart Message (if you have MyChart) OR A paper copy in the mail If you have any lab test that is abnormal or we need to change your treatment, we will call you to review the results.  Testing/Procedures:   You are scheduled for Cardiac MRI at the location below.    [facility] [address] [place], [place] [address] Please take advantage of the free valet parking available at the [facility] and [employer] (Entrance C).  Proceed to the [facility] Radiology Department (First Floor) for check-in.   OR   [facility] [address] [place], [place] [address] Please go to the [facility] and check-in with the desk attendant.   Magnetic resonance imaging (MRI) is a painless test that produces images of the inside of the body without using Xrays.  During an MRI, strong magnets and radio waves work together in a Data processing manager to form detailed images.   MRI images may provide more details about a medical condition than X-rays, CT scans, and ultrasounds can provide.  You may be given earphones to listen for instructions.  You may eat a light breakfast and take medications as ordered with the exception of furosemide, hydrochlorothiazide, chlorthalidone or spironolactone (or any other fluid pill). If you are undergoing a stress MRI, please avoid stimulants for 12 hr prior to test. (I.e. Caffeine, nicotine, chocolate, or antihistamine medications)  If your provider has ordered anti-anxiety medications for this test, then you will need a driver.  An IV will be inserted into one of  your veins. Contrast material will be injected into your IV. It will leave your body through your urine within a day. You may be told to drink plenty of fluids to help flush the contrast material out of your system.  You will be asked to remove all metal, including: Watch, jewelry, and other metal objects including hearing aids, hair pieces and dentures. Also wearable glucose monitoring systems (ie. Freestyle Libre and Omnipods) (Braces and fillings normally are not a problem.)   TEST WILL TAKE APPROXIMATELY 1 HOUR  PLEASE NOTIFY SCHEDULING AT LEAST 24 HOURS IN ADVANCE IF YOU ARE UNABLE TO KEEP YOUR APPOINTMENT. [phone]  For more information and frequently asked questions, please visit our website : [email or web address]  Please call the Cardiac Imaging Nurse Navigators with any questions/concerns. [phone] Office      Follow-Up: At [facility], you and your health needs are our priority.  As part of our continuing mission to provide you with exceptional heart care, our providers are all part of one team.  This team includes your primary Cardiologist (physician) and Advanced Practice Providers or APPs (Physician Assistants and Nurse Practitioners) who all work together to provide you with the care you need, when you need it.  Your next appointment:   2-3 month(s)  Provider:   Dorn Lesches, MD   We recommend signing up for the patient portal called MyChart.  Sign up information is provided on this After Visit Summary.  MyChart is used to connect with patients for Virtual Visits (Telemedicine).  Patients are able to view lab/test results, encounter notes,  upcoming appointments, etc.  Non-urgent messages can be sent to your provider as well.   To learn more about what you can do with MyChart, go to [email or web address].

## 2024-02-20 ENCOUNTER — Ambulatory Visit (HOSPITAL_BASED_OUTPATIENT_CLINIC_OR_DEPARTMENT_OTHER): Payer: Self-pay | Admitting: Family

## 2024-03-14 ENCOUNTER — Encounter: Payer: Self-pay | Admitting: Family Medicine

## 2024-03-14 ENCOUNTER — Ambulatory Visit (INDEPENDENT_AMBULATORY_CARE_PROVIDER_SITE_OTHER): Admitting: Family Medicine

## 2024-03-14 VITALS — BP 120/60 | Ht 69.0 in | Wt 170.0 lb

## 2024-03-14 DIAGNOSIS — M545 Low back pain, unspecified: Secondary | ICD-10-CM

## 2024-03-14 DIAGNOSIS — G8929 Other chronic pain: Secondary | ICD-10-CM | POA: Diagnosis not present

## 2024-03-14 DIAGNOSIS — M419 Scoliosis, unspecified: Secondary | ICD-10-CM | POA: Diagnosis not present

## 2024-03-14 DIAGNOSIS — M47816 Spondylosis without myelopathy or radiculopathy, lumbar region: Secondary | ICD-10-CM | POA: Diagnosis not present

## 2024-03-14 MED ORDER — PREDNISONE 10 MG PO TABS: ORAL_TABLET | ORAL | 0 refills | Status: AC

## 2024-03-14 NOTE — Progress Notes (Signed)
 DATE OF VISIT: 03/14/2024        Zachary Lara DOB: 1960/12/15 MRN: [id number]  CC:  low back pain  History of present Illness: Zachary Lara is a 63 y.o. male who presents relation of low back pain Referred by PCP Dr. Bernardino Collier with [facility] - Last seen by Dr. Collier 02/15/2024.  Visit notes from that day reviewed in detail today - X-rays completed at that visit and reviewed as noted below  Patient with longstanding history of chronic low back pain.  Has been acutely worsening over the past 3 months Denies any specific injury or trauma Does have history of underlying scoliosis Feeling pain mainly at the right low back near the SI joint Denies any radiation of the pain Denies any associated numbness or tingling Denies any lower extremity weakness Denies any changes in bowel or bladder Is worse when laying flat Also worse when getting out of bed in the morning Worse with bending forward He did to stop using a riding mower due to increasing pain, now using a push mower Has been using a Velcro weight belt off and on over the last 3 months, also needs to use this to sleep at night Has been using occasional over-the-counter NSAIDs, does have history of ulcers so tries to use these sparingly No prior physical therapy He does try to exercise 2 to 3 days a week He has had prior heart issues when using muscle relaxants, so he tries to avoid these.  Medications:  Outpatient Encounter Medications as of 03/14/2024  Medication Sig   predniSONE  (DELTASONE ) 10 MG tablet Take as directed per MD instructions   acyclovir (ZOVIRAX) 400 MG tablet Take 400 mg by mouth.   Ascorbic Acid (VITAMIN C PO) Take 1 tablet by mouth daily.   Cholecalciferol (VITAMIN D3) 25 MCG (1000 UT) CAPS Take 1 capsule by mouth daily.   Colchicine , Cardiovascular, (LODOCO ) 0.5 MG TABS Take 1 tablet by mouth daily.   diltiazem  (CARDIZEM  CD) 120 MG 24 hr capsule TAKE 1 CAPSULE BY MOUTH EVERY DAY   fish  oil-omega-3 fatty acids 1000 MG capsule Take 2 g by mouth daily.   magnesium oxide (MAG-OX) 400 MG tablet 1 tablet every other day   metoprolol  succinate (TOPROL -XL) 25 MG 24 hr tablet Take 0.5 tablets (12.5 mg total) by mouth daily.   omeprazole (PRILOSEC) 40 MG capsule Take 40 mg by mouth 2 (two) times daily.   rosuvastatin  (CRESTOR ) 5 MG tablet Take 1 tablet (5 mg total) by mouth 3 (three) times a week.   traZODone (DESYREL) 100 MG tablet Take 150 mg by mouth daily.   Facility-Administered Encounter Medications as of 03/14/2024  Medication   gadopentetate dimeglumine  (MAGNEVIST ) injection 17 mL    Allergies: is allergic to cottonseed oil, flax seed oil, other, and sulfa antibiotics.  Physical Examination: Vitals: BP 120/60   Ht 5' 9 (1.753 m)   Wt 170 lb (77.1 kg)   BMI 25.10 kg/m  GENERAL:  Zachary Lara is a 63 y.o. male appearing their stated age, alert and oriented x 3, in no apparent distress.  SKIN: no rashes or lesions, skin clean, dry, intact MSK: L-spine: Scoliosis concave right, no midline tenderness.  Mild right-sided paraspinal tenderness at the lumbosacral junction, mild right-sided SI joint pain.  Decreased forward flexion with associated pain, good extension without pain.  Able to toe walk and heel walk.  Negative straight leg raise bilaterally.  Negative FABER bilaterally.  Lower extremity strength 5/5  bilaterally, does have some pain with resisted hip flexion on the right Hips: Good range of motion without pain.  Negative FABER bilaterally NEURO: sensation intact to light touch, DTR 2/4 Achilles and patella bilaterally VASC: pulses 2+ and symmetric extremity bilaterally, no edema  Radiology: XRAY: L-spine x-rays personally reviewed and interpreted by me from 02/15/2024 showing: - Prominent levoscoliosis - Multilevel degenerative changes, most prominent at the L5-S1 level - No acute bony abnormalities  Sacrum and coccyx x-rays personally reviewed and interpreted  by me from 02/15/2024 showing: - No acute bony abnormalities Assessment & Plan  1. Chronic right-sided low back pain without sciatica 2. Scoliosis of lumbar spine, unspecified scoliosis type 3. Spondylosis without myelopathy or radiculopathy, lumbar region Acute on chronic right-sided low back pain with underlying iliopsoas and spondylosis, no radicular symptoms noted.  Likely has some component of SI joint dysfunction related to his scoliosis  Plan: - Previous visit notes with PCP from 02/15/2024 reviewed as noted above - X-rays reviewed as noted above and reviewed with patient during the visit today - He would benefit from physical therapy.  Referral to [facility] placed for both land-based and aquatic therapy - Rx 6-day prednisone  taper to take as directed - Should limit oral NSAIDs due to cardiac history, as well as prior history of ulcer - Can use Tylenol  Extra Strength 1-2 tabs every 6-8 hours as needed - Will avoid muscle relaxants due to prior history of heart issues while taking them which sounded to be palpitations - He will follow-up with me in 6 to 8 weeks for reevaluation, sooner as needed.  If continues ongoing issues or worsening symptoms we will proceed with MRI of the lumbar spine   Patient expressed understanding & agreement with above.  Encounter Diagnoses  Name Primary?   Chronic right-sided low back pain without sciatica Yes   Scoliosis of lumbar spine, unspecified scoliosis type    Spondylosis without myelopathy or radiculopathy, lumbar region     Orders Placed This Encounter  Procedures   Ambulatory referral to Physical Therapy

## 2024-03-27 ENCOUNTER — Other Ambulatory Visit (HOSPITAL_BASED_OUTPATIENT_CLINIC_OR_DEPARTMENT_OTHER): Payer: Self-pay | Admitting: Family

## 2024-03-27 DIAGNOSIS — E785 Hyperlipidemia, unspecified: Secondary | ICD-10-CM

## 2024-04-03 ENCOUNTER — Encounter (HOSPITAL_COMMUNITY): Payer: Self-pay

## 2024-04-05 ENCOUNTER — Ambulatory Visit (HOSPITAL_COMMUNITY)
Admission: RE | Admit: 2024-04-05 | Discharge: 2024-04-05 | Disposition: A | Discharge status: Home / Self Care | Source: Ambulatory Visit | Attending: Family | Admitting: Family

## 2024-04-05 ENCOUNTER — Other Ambulatory Visit (HOSPITAL_BASED_OUTPATIENT_CLINIC_OR_DEPARTMENT_OTHER): Payer: Self-pay | Admitting: Family

## 2024-04-05 DIAGNOSIS — I311 Chronic constrictive pericarditis: Secondary | ICD-10-CM | POA: Diagnosis present

## 2024-04-05 MED ORDER — GADOBUTROL 1 MMOL/ML IV SOLN
7.0000 mL | Freq: Once | INTRAVENOUS | Status: AC | PRN
  Administered 2024-04-05: 7 mL via INTRAVENOUS

## 2024-04-08 ENCOUNTER — Ambulatory Visit (HOSPITAL_BASED_OUTPATIENT_CLINIC_OR_DEPARTMENT_OTHER): Payer: Self-pay | Admitting: Family

## 2024-04-19 ENCOUNTER — Other Ambulatory Visit (HOSPITAL_BASED_OUTPATIENT_CLINIC_OR_DEPARTMENT_OTHER): Payer: Self-pay | Admitting: Family

## 2024-04-19 DIAGNOSIS — I471 Supraventricular tachycardia, unspecified: Secondary | ICD-10-CM

## 2024-04-19 DIAGNOSIS — I493 Ventricular premature depolarization: Secondary | ICD-10-CM

## 2024-04-24 ENCOUNTER — Encounter: Payer: Self-pay | Admitting: Cardiovascular Disease

## 2024-04-24 ENCOUNTER — Ambulatory Visit: Attending: Cardiovascular Disease | Admitting: Cardiovascular Disease

## 2024-04-24 VITALS — BP 119/68 | HR 74 | Ht 68.5 in | Wt 173.0 lb

## 2024-04-24 DIAGNOSIS — E785 Hyperlipidemia, unspecified: Secondary | ICD-10-CM | POA: Insufficient documentation

## 2024-04-24 DIAGNOSIS — I319 Disease of pericardium, unspecified: Secondary | ICD-10-CM

## 2024-04-24 DIAGNOSIS — E782 Mixed hyperlipidemia: Secondary | ICD-10-CM | POA: Diagnosis not present

## 2024-04-24 NOTE — Assessment & Plan Note (Signed)
 History of radical pericardiectomy performed at [facility] in [place]  in 2013.

## 2024-04-24 NOTE — Patient Instructions (Signed)
 Medication Instructions:  Your physician recommends that you continue on your current medications as directed. Please refer to the Current Medication list given to you today.  *If you need a refill on your cardiac medications before your next appointment, please call your pharmacy*   Follow-Up: At [facility], you and your health needs are our priority.  As part of our continuing mission to provide you with exceptional heart care, our providers are all part of one team.  This team includes your primary Cardiologist (physician) and Advanced Practice Providers or APPs (Physician Assistants and Nurse Practitioners) who all work together to provide you with the care you need, when you need it.  Your next appointment:   6 month(s)  Provider:   Reche Finder, NP   Then Dr. Court in 12 months.

## 2024-04-24 NOTE — Assessment & Plan Note (Signed)
 History of hyperlipidemia on low-dose rosuvastatin  with lipid profile performed 04/18/2024 revealing total cholesterol of 72, LDL 33 and HDL 41.

## 2024-04-24 NOTE — Progress Notes (Signed)
 04/24/2024 Zachary Lara   [birth year]  [id number]  Primary Physician Dayna Motto, DO Primary Cardiologist: Dorn JINNY Lesches MD FACP, [place], [place], [place]  HPI:  Zachary Lara is a 63 y.o.  thin and fit appearing married Caucasian male father of 3 (2 stepchildren), grandfather 3 grandchildren who I last saw in the office 01/19/2022.  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  He works in Consulting civil engineer.  He drinks socially.  He has had 2 brothers that you have had stents.  He is never had a heart attack or stroke.  He denies chest pain or shortness of breath.  He exercises vigorously without limitation.  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  He also had a VATS procedure and what sounds like pleurodesis subsequent to that.  He has been followed at [facility] by Dr. Cleotilde every other year and is here to be established in my practice.  He apparently had infection in his leg and was treated with Septra and doxycycline .  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  He was treated with steroids.  He then had labile hypertension and tachycardia.  All the symptoms over have since resolved.  He saw Reche Finder in the office 01/13/2022 who ordered a 2-week Zio patch which apparently showed runs of SVT, frequent PVCs although he currently does not feel palpitations.  Because of recurrent chest pain he did have a coronary CTA performed that revealed coronary calcium  score of 0 with no evidence of CAD on 07/30/2022.  He also had a cardiac MRI performed 9/26 #25 that was entirely normal.  Since I saw him 2 years ago he does complain of atypical chest pain usually occurring when he  gets a bad night sleep and sometimes with exertion although his workup has been unrevealing.   Current Meds  Medication Sig   acyclovir (ZOVIRAX) 400 MG tablet Take 400 mg by mouth.   Ascorbic Acid (VITAMIN C PO) Take 1 tablet by mouth daily.   Cholecalciferol (VITAMIN D3) 25 MCG (1000 UT) CAPS Take 1 capsule by mouth daily.   Colchicine , Cardiovascular, (LODOCO ) 0.5 MG TABS Take 1 tablet by mouth daily.   diltiazem  (CARDIZEM  CD) 120 MG 24 hr capsule TAKE 1 CAPSULE BY MOUTH EVERY DAY   fish oil-omega-3 fatty acids 1000 MG capsule Take 2 g by mouth daily.   magnesium oxide (MAG-OX) 400 MG tablet 1 tablet every other day   metoprolol  succinate (TOPROL -XL) 25 MG 24 hr tablet Take 0.5 tablets (12.5 mg total) by mouth daily.   omeprazole (PRILOSEC) 40 MG capsule Take 40 mg by mouth 2 (two) times daily.   predniSONE  (DELTASONE ) 10 MG tablet Take as directed per MD instructions   rosuvastatin  (CRESTOR ) 5 MG tablet TAKE 1 TABLET (5 MG TOTAL) BY MOUTH DAILY.   traZODone (DESYREL) 100 MG tablet Take 150 mg by mouth daily.     Allergies  Allergen Reactions   Cottonseed Oil Hives   Flax Seed Oil Hives   Other Hives    Dark chocolate, hot peppers    Sulfa Antibiotics     Other reaction(s): shortness of breath; Stevens-Johnson Syndrome    Social History  Socioeconomic History   Marital status: Married    Spouse name: Virginia    Number of children: Not on file   Years of education: 16   Highest education level: Not on file  Occupational History   Occupation: Civil Service fast streamer  Tobacco Use   Smoking status: Never   Smokeless tobacco: Never  Vaping Use   Vaping status: Never Used  Substance and Sexual Activity   Alcohol use: Not Currently   Drug use: No   Sexual activity: Yes    Partners: Female  Other Topics Concern   Not on file  Social History Narrative   Lives with wife   Caffeine use: 2 cups coffee every morning   Social Drivers of Corporate investment banker Strain:  Not on file  Food Insecurity: Not on file  Transportation Needs: Not on file  Physical Activity: Not on file  Stress: Not on file  Social Connections: Not on file  Intimate Partner Violence: Not on file     Review of Systems: General: negative for chills, fever, night sweats or weight changes.  Cardiovascular: negative for chest pain, dyspnea on exertion, edema, orthopnea, palpitations, paroxysmal nocturnal dyspnea or shortness of breath Dermatological: negative for rash Respiratory: negative for cough or wheezing Urologic: negative for hematuria Abdominal: negative for nausea, vomiting, diarrhea, bright red blood per rectum, melena, or hematemesis Neurologic: negative for visual changes, syncope, or dizziness All other systems reviewed and are otherwise negative except as noted above.    Blood pressure 119/68, pulse 74, height 5' 8.5 (1.74 m), weight 173 lb (78.5 kg), SpO2 96%.  General appearance: alert and no distress Neck: no adenopathy, no carotid bruit, no JVD, supple, symmetrical, trachea midline, and thyroid  not enlarged, symmetric, no tenderness/mass/nodules Lungs: clear to auscultation bilaterally Heart: regular rate and rhythm, S1, S2 normal, no murmur, click, rub or gallop Extremities: extremities normal, atraumatic, no cyanosis or edema Pulses: 2+ and symmetric Skin: Skin color, texture, turgor normal. No rashes or lesions Neurologic: Grossly normal  EKG not performed today      ASSESSMENT AND PLAN:   Disease of pericardium History of radical pericardiectomy performed at [facility] in [place]  in 2013.  Hyperlipidemia History of hyperlipidemia on low-dose rosuvastatin  with lipid profile performed 04/18/2024 revealing total cholesterol of 72, LDL 33 and HDL 41.     Dorn DOROTHA Lesches MD FACP,FACC,FAHA, [facility] 04/24/2024 3:18 PM

## 2024-05-21 ENCOUNTER — Ambulatory Visit (HOSPITAL_BASED_OUTPATIENT_CLINIC_OR_DEPARTMENT_OTHER): Attending: Family Medicine | Admitting: Physical Therapy

## 2024-05-21 ENCOUNTER — Other Ambulatory Visit: Payer: Self-pay

## 2024-05-21 ENCOUNTER — Encounter (HOSPITAL_BASED_OUTPATIENT_CLINIC_OR_DEPARTMENT_OTHER): Payer: Self-pay | Admitting: Physical Therapy

## 2024-05-21 DIAGNOSIS — M419 Scoliosis, unspecified: Secondary | ICD-10-CM | POA: Diagnosis not present

## 2024-05-21 DIAGNOSIS — M545 Low back pain, unspecified: Secondary | ICD-10-CM | POA: Insufficient documentation

## 2024-05-21 DIAGNOSIS — M5459 Other low back pain: Secondary | ICD-10-CM

## 2024-05-21 DIAGNOSIS — R293 Abnormal posture: Secondary | ICD-10-CM | POA: Diagnosis not present

## 2024-05-21 DIAGNOSIS — R262 Difficulty in walking, not elsewhere classified: Secondary | ICD-10-CM | POA: Insufficient documentation

## 2024-05-21 DIAGNOSIS — M47816 Spondylosis without myelopathy or radiculopathy, lumbar region: Secondary | ICD-10-CM | POA: Insufficient documentation

## 2024-05-21 DIAGNOSIS — G8929 Other chronic pain: Secondary | ICD-10-CM | POA: Insufficient documentation

## 2024-05-21 NOTE — Therapy (Addendum)
 [facility] OUTPATIENT PHYSICAL THERAPY THORACOLUMBAR EVALUATION   Patient Name: Zachary Lara MRN: [id number] DOB:June 28, 1961, 63 y.o., male Today's Date: 05/28/2024  END OF SESSION:   PT End of Session - 05/21/24 1621     Visit Number 1    Date for Recertification  07/19/24    Authorization Type BCBS    PT Start Time 1531    PT Stop Time 1615    PT Time Calculation (min) 44 min    Activity Tolerance Patient tolerated treatment well    Behavior During Therapy WFL for tasks assessed/performed          Past Medical History:  Diagnosis Date   Arthritis    Dizziness    SINCE TAKING FLUID PILL   Granulomatous hepatitis    H/O hiatal hernia    Hemorrhoids    Pericarditis    H/O, chronic calcific,normal stress cardiolite  10/20/2003 EF 52%   Pulmonary edema    CURRENTLY HAS ABNORMAL CXR - FOLLOWED BY CARDIOLOGIST AT [facility]   PVC (premature ventricular contraction)    Shortness of breath    WITH EXERTION -    SVT (supraventricular tachycardia)    Ulcer    NON-BLEEDING STOMACH ULCER   Ventricular fibrillation (HCC)    POST OP PARICARDECTOMY - NO EPISODES SINCE   Past Surgical History:  Procedure Laterality Date   BAND HEMORRHOIDECTOMY     EXCISIONAL HEMORRHOIDECTOMY     LIVER BIOPSY     LUNG SURGERY  2013   pericardial stripping  08/19/11   [facility]   TONSILLECTOMY     Patient Active Problem List   Diagnosis Date Noted   Hyperlipidemia 04/24/2024   OSA (obstructive sleep apnea) 09/30/2021   Thrombocytopenia 03/25/2021   Atopy 03/25/2021   Unilateral occipital headache 10/18/2015   Transient vision disturbance 08/27/2015   Pulsatile tinnitus 08/27/2015   New onset of headaches after age 79 08/27/2015   Numbness 08/27/2015   Eosinophilia 04/29/2014   Pericarditis    Internal bleeding hemorrhoids 10/06/2011   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   Disease of pericardium 09/16/2010   FATIGUE 09/16/2010   NONSPECIFIC ABNORMAL RESULTS LIVR FUNCTION STUDY 09/16/2010    History of colonic polyps 09/16/2010    PCP: Bernardino Boone DO  REFERRING PROVIDER: Rainell Cedar MD  REFERRING DIAG:  M54.50,G89.29 (ICD-10-CM) - Chronic right-sided low back pain without sciatica  M41.9 (ICD-10-CM) - Scoliosis of lumbar spine, unspecified scoliosis type  M47.816 (ICD-10-CM) - Spondylosis without myelopathy or radiculopathy, lumbar region    Rationale for Evaluation and Treatment: Rehabilitation  THERAPY DIAG:  Abnormal posture - Plan: PT plan of care cert/re-cert  Other low back pain  Difficulty in walking, not elsewhere classified - Plan: PT plan of care cert/re-cert  ONSET DATE: chronic with exacerbation last few years  SUBJECTIVE:  SUBJECTIVE STATEMENT: I have had back pain for years.  I have been doing exercises at home with my equipment 3 days a week.  I have problems lying down and sleeping. I do squats, bench press you name it without any problems.  Punching bag.  Go back to see spine MD dec 15.  No MRI.  Have to be seen by you (PT) first.  Go back to see Dr Debby Dec 15  PERTINENT HISTORY:  Evaluation and Treatment Land and aquatics Low back pain  PAIN:  Are you having pain? Yes: NPRS scale: current 1/10; worst 4/10 Pain location: left lb at L5-s1 area Pain description: sharp pain in am, constant ache pain Aggravating factors: lying flat; bending forward, riding lawn mower Relieving factors: stretching  PRECAUTIONS: None  RED FLAGS: None   WEIGHT BEARING RESTRICTIONS: No  FALLS:  Has patient fallen in last 6 months? Yes. Number of falls 1  LIVING ENVIRONMENT: Lives with: lives with their spouse Lives in: House/apartment   OCCUPATION: IT  PLOF: Independent  PATIENT GOALS: tighten core, slow progression of scoliosis, do something about that crushed  disc.  NEXT MD VISIT: Dec 15  OBJECTIVE:  Note: Objective measures were completed at Evaluation unless otherwise noted.  DIAGNOSTIC FINDINGS:  XRAY: L-spine x-rays personally reviewed and interpreted by me from 02/15/2024 showing: - Prominent levoscoliosis - Multilevel degenerative changes, most prominent at the L5-S1 level - No acute bony abnormalities  PATIENT SURVEYS:  ODI: 9/50=18%  COGNITION: Overall cognitive status: Within functional limits for tasks assessed     SENSATION: WFL  MUSCLE LENGTH: Hamstrings: wfl   POSTURE: Lumbar levoscoliosis  PALPATION: Minimal TTP left LB l5-S1 area  LUMBAR ROM:   Flex/ext full Lateral flex R/L full P!  LOWER EXTREMITY ROM:     WFL  LOWER EXTREMITY MMT:    MMT Right eval Left eval  Hip flexion 28.8 34.3  Hip extension    Hip abduction    Hip adduction    Hip internal rotation    Hip external rotation    Knee flexion    Knee extension    Ankle dorsiflexion    Ankle plantarflexion    Ankle inversion    Ankle eversion     (Blank rows = not tested)  LUMBAR SPECIAL TESTS:  Straight leg raise test: Negative and FABER test: Negative  FUNCTIONAL TESTS:  5 times sit to stand: 11.94 Timed up and go (TUG): 9.87  GAIT: Distance walked: no limitations, normal gait   TREATMENT  Eval Self care:Posture and body curator instruction. Frequency and duration of exercise; avoid inversion  table use.                                                                                                                                 PATIENT EDUCATION:  Education details: Discussed eval findings, rehab rationale, aquatic program progression/POC and pools in area. Patient is in agreement  Person  educated: Patient Education method: Explanation Education comprehension: verbalized understanding  HOME EXERCISE PROGRAM: Pt indep with person exercise program Aquatic TBA as approp  ASSESSMENT:  CLINICAL IMPRESSION: Patient  is a 63 y.o. m who was seen today for physical therapy evaluation and treatment for LBP with a 34d levoscoliosis and lumbar spodylosis. He reports he has known about the scoliosis for years but has not had any therapy for it.  Pain for >10 years with an exacerbation over the past 3.  He is an active exerciser using his personal gym at home 3 days a week working core and LE. He reports avoiding leg lifts as his LB pops.  Has  an inversion table but doesn't use as it is painful. Main compliant is he has increased pain with lying down and sleeping.  He is able to tolerate all activities standing and sitting working through pain.  Wears a weight lifting belt when he is working out in the yard and a corset when he sleeps. Pt goals are to impair the progression of the scoliosis, tighten core and reduce pain.  He will benefit from a short episode of skilled PT with combination of aquatics and land. Aquatic for basis exercises/stretching and to instruct on using the properties of water for pain management. Land for progressive stretching and exercises program to reach stated goals.  OBJECTIVE IMPAIRMENTS: decreased strength, postural dysfunction, and pain.   ACTIVITY LIMITATIONS: sleeping  PARTICIPATION LIMITATIONS: riding lawnmower  REHAB POTENTIAL: Good  CLINICAL DECISION MAKING: Stable/uncomplicated  EVALUATION COMPLEXITY: Low   GOALS: Goals reviewed with patient? Yes  SHORT TERM GOALS: Target date: 06/09/24  Pt to be indep with aquatic exercises/stretching an positioning for pain management Baseline: Goal status: INITIAL  2.  Pt will consider gaining pool access for use of the properties of water for chronic conditions maintaining mobility and minimizing pain. Baseline:  Goal status: INITIAL      LONG TERM GOALS: Target date: 07/20/23  Pt to improve on ODI by 4% (7/50) to demonstrate improvement in function particularly with sleeping Baseline: 9/50=18% Goal status: INITIAL  2.  Pt  will be indep with final HEP for management of progressive scoliosis Baseline:  Goal status: INITIAL  3.  Pt will report improved ability to tolerate lying for improved sleep Baseline:  Goal status: INITIAL  4.  Pt will demonstrate mindfulness of his spinal position to improve alignment encouraging improved posture. Baseline:  Goal status: INITIAL  5.  Pt will report decrease in pain by at least 50% for improved toleration to activity/quality of life and to demonstrate improved management of pain. Baseline:  Goal status: INITIAL    PLAN:  PT FREQUENCY: 1-2x/week  PT DURATION: 8 weeks   PLANNED INTERVENTIONS: 97164- PT Re-evaluation, 97750- Physical Performance Testing, 97110-Therapeutic exercises, 97530- Therapeutic activity, [id number]- Neuromuscular re-education, 97535- Self Care, [id number]- Manual therapy, [id number]- Gait training, [phone]- Aquatic Therapy, [phone] (1-2 muscles), [id number] (3+ muscles)- Dry Needling, Patient/Family education, Balance training, Stair training, Taping, Joint mobilization, DME instructions, Cryotherapy, and Moist heat.  PLAN FOR NEXT SESSION: aquatic x 2-3 sessions or instruction on use of properties of water for core strengthening and pain management Land: core/hip strengthening; HEP   Ronal Foots) Shaniah Baltes MPT 05/28/24 3:31 PM [facility] [address] [place], [place], [address] Phone: [phone]   Fax:  [phone]  Addend Ronal Foots) Vaishnavi Dalby MPT 05/28/24 3:31 PM [facility] [address] [place], [place], [address] Phone: [phone]  Fax:  [phone]

## 2024-05-28 ENCOUNTER — Encounter (HOSPITAL_BASED_OUTPATIENT_CLINIC_OR_DEPARTMENT_OTHER): Payer: Self-pay | Admitting: Physical Therapy

## 2024-05-28 ENCOUNTER — Ambulatory Visit (HOSPITAL_BASED_OUTPATIENT_CLINIC_OR_DEPARTMENT_OTHER): Admitting: Physical Therapy

## 2024-05-28 DIAGNOSIS — M5459 Other low back pain: Secondary | ICD-10-CM

## 2024-05-28 DIAGNOSIS — R262 Difficulty in walking, not elsewhere classified: Secondary | ICD-10-CM

## 2024-05-28 DIAGNOSIS — M545 Low back pain, unspecified: Secondary | ICD-10-CM | POA: Diagnosis not present

## 2024-05-28 DIAGNOSIS — R293 Abnormal posture: Secondary | ICD-10-CM

## 2024-05-28 NOTE — Therapy (Signed)
 OUTPATIENT PHYSICAL THERAPY THORACOLUMBAR TREATMENT   Patient Name: Zachary Lara MRN: [id number] DOB:[birth year], 63 y.o., male Today's Date: 05/28/2024  END OF SESSION:  PT End of Session - 05/28/24 1543     Visit Number 2    Date for Recertification  07/19/24    Authorization Type BCBS    PT Start Time 1533    PT Stop Time 1612    PT Time Calculation (min) 39 min    Behavior During Therapy WFL for tasks assessed/performed           Past Medical History:  Diagnosis Date   Arthritis    Dizziness    SINCE TAKING FLUID PILL   Granulomatous hepatitis    H/O hiatal hernia    Hemorrhoids    Pericarditis    H/O, chronic calcific,normal stress cardiolite  10/20/2003 EF 52%   Pulmonary edema    CURRENTLY HAS ABNORMAL CXR - FOLLOWED BY CARDIOLOGIST AT [facility]   PVC (premature ventricular contraction)    Shortness of breath    WITH EXERTION -    SVT (supraventricular tachycardia)    Ulcer    NON-BLEEDING STOMACH ULCER   Ventricular fibrillation (HCC)    POST OP PARICARDECTOMY - NO EPISODES SINCE   Past Surgical History:  Procedure Laterality Date   BAND HEMORRHOIDECTOMY     EXCISIONAL HEMORRHOIDECTOMY     LIVER BIOPSY     LUNG SURGERY  2013   pericardial stripping  08/19/11   [facility]   TONSILLECTOMY     Patient Active Problem List   Diagnosis Date Noted   Hyperlipidemia 04/24/2024   OSA (obstructive sleep apnea) 09/30/2021   Thrombocytopenia 03/25/2021   Atopy 03/25/2021   Unilateral occipital headache 10/18/2015   Transient vision disturbance 08/27/2015   Pulsatile tinnitus 08/27/2015   New onset of headaches after age 18 08/27/2015   Numbness 08/27/2015   Eosinophilia 04/29/2014   Pericarditis    Internal bleeding hemorrhoids 10/06/2011   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   Disease of pericardium 09/16/2010   FATIGUE 09/16/2010   NONSPECIFIC ABNORMAL RESULTS LIVR FUNCTION STUDY 09/16/2010   History of colonic polyps 09/16/2010    PCP: Bernardino Boone  DO  REFERRING PROVIDER: Rainell Cedar MD  REFERRING DIAG:  M54.50,G89.29 (ICD-10-CM) - Chronic right-sided low back pain without sciatica  M41.9 (ICD-10-CM) - Scoliosis of lumbar spine, unspecified scoliosis type  M47.816 (ICD-10-CM) - Spondylosis without myelopathy or radiculopathy, lumbar region    Rationale for Evaluation and Treatment: Rehabilitation  THERAPY DIAG:  Other low back pain  Abnormal posture  Difficulty in walking, not elsewhere classified  ONSET DATE: chronic with exacerbation last few years  SUBJECTIVE:  SUBJECTIVE STATEMENT: Pt reports no new changes since initial evaluation. Pt reports he hasn't been in pool in ~4 yrs but knows how to swim and is not afraid of water.   From initial evaluation:  I have had back pain for years.  I have been doing exercises at home with my equipment 3 days a week.  I have problems lying down and sleeping. I do squats, bench press you name it without any problems.  Punching bag.  Go back to see spine MD dec 15.  No MRI.  Have to be seen by you (PT) first.  Go back to see Dr Debby Dec 15  PERTINENT HISTORY:  Evaluation and Treatment Land and aquatics Low back pain  PAIN:  Are you having pain? Yes: NPRS scale: current 1/10; worst 4/10 Pain location: left Lower back at L5-s1 area Pain description: sharp pain in am, constant ache pain Aggravating factors: lying flat; bending forward, riding lawn mower Relieving factors: stretching  PRECAUTIONS: None  RED FLAGS: None   WEIGHT BEARING RESTRICTIONS: No  FALLS:  Has patient fallen in last 6 months? Yes. Number of falls 1  LIVING ENVIRONMENT: Lives with: lives with their spouse Lives in: House/apartment   OCCUPATION: IT  PLOF: Independent  PATIENT GOALS: tighten core, slow progression  of scoliosis, do something about that crushed disc.  NEXT MD VISIT: Dec 15  OBJECTIVE:  Note: Objective measures were completed at Evaluation unless otherwise noted.  DIAGNOSTIC FINDINGS:  XRAY: L-spine x-rays personally reviewed and interpreted by me from 02/15/2024 showing: - Prominent levoscoliosis - Multilevel degenerative changes, most prominent at the L5-S1 level - No acute bony abnormalities  PATIENT SURVEYS:  ODI: 9/50=18%  COGNITION: Overall cognitive status: Within functional limits for tasks assessed     SENSATION: WFL  MUSCLE LENGTH: Hamstrings: wfl   POSTURE: Lumbar levoscoliosis  PALPATION: Minimal TTP left LB l5-S1 area  LUMBAR ROM:   Flex/ext full Lateral flex R/L full P!  LOWER EXTREMITY ROM:     WFL  LOWER EXTREMITY MMT:    MMT Right eval Left eval  Hip flexion 28.8 34.3  Hip extension    Hip abduction    Hip adduction    Hip internal rotation    Hip external rotation    Knee flexion    Knee extension    Ankle dorsiflexion    Ankle plantarflexion    Ankle inversion    Ankle eversion     (Blank rows = not tested)  LUMBAR SPECIAL TESTS:  Straight leg raise test: Negative and FABER test: Negative  FUNCTIONAL TESTS:  5 times sit to stand: 11.94 Timed up and go (TUG): 9.87  GAIT: Distance walked: no limitations, normal gait   TREATMENT  [facility] Adult PT Treatment:                                             Date: 05/28/24 Pt seen for aquatic therapy today.  Treatment took place in water 3.5-4.75 ft in depth at the [facility] pool. Temp of water was 91.  Pt entered/exited the pool via stairs with bil rail.  - Intro to aquatic therapy principles - unsupported walking forward/ backward with cues for vertical trunk and even step length  - unsupported side stepping -> with arm add/abdct with rainbow hand float - suitcase carry with bil rainbow -> yellow hand floats walking/marching forward/backward ->  NOT tolerated with  single (regardless of rainbow or yellow) - TrA set with hollow noodle pull down to thighs x 10 in standard stance -> staggered stance -> solid noodle in standard stance  - UE on black noodle:  toe/heel raises x10; hip add/abd x10 ; hip flexion /extension x10 - at bench in water:  plank with hip extension (cues to decrease height of LEs); side plank R/L (some pain when in Rt plank  Pt requires the buoyancy and hydrostatic pressure of water for support, and to offload joints by unweighting joint load by at least 50 % in navel deep water and by at least 75-80% in chest to neck deep water.  Viscosity of the water is needed for resistance of strengthening. Water current perturbations provides challenge to standing balance requiring increased core activation.     PATIENT EDUCATION:  Education details: intro to aquatic therapy Person educated: Patient Education method: Explanation Education comprehension: verbalized understanding  HOME EXERCISE PROGRAM: Pt indep with person exercise program Aquatic TBA as approp  ASSESSMENT:  CLINICAL IMPRESSION: Pt demonstrates safety and independence in aquatic setting with therapist instructing from deck. Pt is confident in setting, moving throughout all depths easily.  Pt reports sharp brief pain in Rt LB with change in directions and position despite TrA engagement; resolves gradually. Single suitcase carry not tolerated due to increased pain, but bil carry is tolerated fine.  Will plan to progress as tolerated. Goals are ongoing.    From initial evaluation:  Patient is a 63 y.o. m who was seen today for physical therapy evaluation and treatment for LBP with a 34d levoscoliosis and lumbar spodylosis. He reports he has known about the scoliosis for years but has not had any therapy for it.  Pain for >10 years with an exacerbation over the past 3.  He is an active exerciser using his personal gym at home 3 days a week working core and LE. He reports avoiding  leg lifts as his LB pops.  Has  an inversion table but doesn't use as it is painful. Main compliant is he has increased pain with lying down and sleeping.  He is able to tolerate all activities standing and sitting working through pain.  Wears a weight lifting belt when he is working out in the yard and a corset when he sleeps. Pt goals are to impair the progression of the scoliosis, tighten core and reduce pain.  He will benefit from a short episode of skilled PT with combination of aquatics and land. Aquatic for basis exercises/stretching and to instruct on using the properties of water for pain management. Land for progressive stretching and exercises program to reach stated goals.  OBJECTIVE IMPAIRMENTS: decreased strength, postural dysfunction, and pain.   ACTIVITY LIMITATIONS: sleeping  PARTICIPATION LIMITATIONS: riding lawnmower  REHAB POTENTIAL: Good  CLINICAL DECISION MAKING: Stable/uncomplicated  EVALUATION COMPLEXITY: Low   GOALS: Goals reviewed with patient? Yes  SHORT TERM GOALS: Target date: 06/09/24  Pt to be indep with aquatic exercises/stretching an positioning for pain management Baseline: Goal status: INITIAL  2.  Pt will consider gaining pool access for use of the properties of water for chronic conditions maintaining mobility and minimizing pain. Baseline:  Goal status: INITIAL      LONG TERM GOALS: Target date: 07/20/23  Pt to improve on ODI by 4% (7/50) to demonstrate improvement in function particularly with sleeping Baseline: 9/50=18% Goal status: INITIAL  2.  Pt will be indep with final HEP for management of progressive  scoliosis Baseline:  Goal status: INITIAL  3.  Pt will report improved ability to tolerate lying for improved sleep Baseline:  Goal status: INITIAL  4.  Pt will demonstrate mindfulness of his spinal position to improve alignment encouraging improved posture. Baseline:  Goal status: INITIAL  5.  Pt will report decrease in pain  by at least 50% for improved toleration to activity/quality of life and to demonstrate improved management of pain. Baseline:  Goal status: INITIAL    PLAN:  PT FREQUENCY: 1-2x/week  PT DURATION: 8 weeks   PLANNED INTERVENTIONS: 97164- PT Re-evaluation, 97750- Physical Performance Testing, 97110-Therapeutic exercises, 97530- Therapeutic activity, [id number]- Neuromuscular re-education, 97535- Self Care, [id number]- Manual therapy, [id number]- Gait training, [phone]- Aquatic Therapy, [phone] (1-2 muscles), [phone] (3+ muscles)- Dry Needling, Patient/Family education, Balance training, Stair training, Taping, Joint mobilization, DME instructions, Cryotherapy, and Moist heat.  PLAN FOR NEXT SESSION: aquatic x 2-3 sessions or instruction on use of properties of water for core strengthening and pain management Land: core/hip strengthening; HEP  Delon Aquas, PTA 05/28/24 5:55 PM [facility] [address] [place], [place], [address] Phone: [phone]   Fax:  [phone]

## 2024-06-04 ENCOUNTER — Ambulatory Visit (HOSPITAL_BASED_OUTPATIENT_CLINIC_OR_DEPARTMENT_OTHER): Admitting: Physical Therapy

## 2024-06-04 ENCOUNTER — Encounter (HOSPITAL_BASED_OUTPATIENT_CLINIC_OR_DEPARTMENT_OTHER): Payer: Self-pay | Admitting: Physical Therapy

## 2024-06-04 DIAGNOSIS — R293 Abnormal posture: Secondary | ICD-10-CM

## 2024-06-04 DIAGNOSIS — M5459 Other low back pain: Secondary | ICD-10-CM

## 2024-06-04 DIAGNOSIS — R262 Difficulty in walking, not elsewhere classified: Secondary | ICD-10-CM

## 2024-06-04 DIAGNOSIS — M545 Low back pain, unspecified: Secondary | ICD-10-CM | POA: Diagnosis not present

## 2024-06-04 NOTE — Therapy (Signed)
 OUTPATIENT PHYSICAL THERAPY THORACOLUMBAR TREATMENT   Patient Name: Zachary Lara MRN: [id number] DOB:30-Mar-1961, 63 y.o., male Today's Date: 06/04/2024  END OF SESSION:  PT End of Session - 06/04/24 1530     Visit Number 3    Date for Recertification  07/19/24    Authorization Type BCBS    PT Start Time 1526    PT Stop Time 1605    PT Time Calculation (min) 39 min    Activity Tolerance Patient tolerated treatment well    Behavior During Therapy WFL for tasks assessed/performed           Past Medical History:  Diagnosis Date   Arthritis    Dizziness    SINCE TAKING FLUID PILL   Granulomatous hepatitis    H/O hiatal hernia    Hemorrhoids    Pericarditis    H/O, chronic calcific,normal stress cardiolite  10/20/2003 EF 52%   Pulmonary edema    CURRENTLY HAS ABNORMAL CXR - FOLLOWED BY CARDIOLOGIST AT [facility]   PVC (premature ventricular contraction)    Shortness of breath    WITH EXERTION -    SVT (supraventricular tachycardia)    Ulcer    NON-BLEEDING STOMACH ULCER   Ventricular fibrillation (HCC)    POST OP PARICARDECTOMY - NO EPISODES SINCE   Past Surgical History:  Procedure Laterality Date   BAND HEMORRHOIDECTOMY     EXCISIONAL HEMORRHOIDECTOMY     LIVER BIOPSY     LUNG SURGERY  2013   pericardial stripping  08/19/11   [facility]   TONSILLECTOMY     Patient Active Problem List   Diagnosis Date Noted   Hyperlipidemia 04/24/2024   OSA (obstructive sleep apnea) 09/30/2021   Thrombocytopenia 03/25/2021   Atopy 03/25/2021   Unilateral occipital headache 10/18/2015   Transient vision disturbance 08/27/2015   Pulsatile tinnitus 08/27/2015   New onset of headaches after age 1 08/27/2015   Numbness 08/27/2015   Eosinophilia 04/29/2014   Pericarditis    Internal bleeding hemorrhoids 10/06/2011   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   Disease of pericardium 09/16/2010   FATIGUE 09/16/2010   NONSPECIFIC ABNORMAL RESULTS LIVR FUNCTION STUDY 09/16/2010    History of colonic polyps 09/16/2010    PCP: Bernardino Boone DO  REFERRING PROVIDER: Rainell Cedar MD  REFERRING DIAG:  M54.50,G89.29 (ICD-10-CM) - Chronic right-sided low back pain without sciatica  M41.9 (ICD-10-CM) - Scoliosis of lumbar spine, unspecified scoliosis type  M47.816 (ICD-10-CM) - Spondylosis without myelopathy or radiculopathy, lumbar region    Rationale for Evaluation and Treatment: Rehabilitation  THERAPY DIAG:  Other low back pain  Abnormal posture  Difficulty in walking, not elsewhere classified  ONSET DATE: chronic with exacerbation last few years  SUBJECTIVE:  SUBJECTIVE STATEMENT: Pt reports he did well after last visit; was surprised how heavy he felt when he reached the third step exiting pool.   POOL ACCESS: considering doing guest pass when wife comes to [facility] (she's a member)  From initial evaluation:  I have had back pain for years.  I have been doing exercises at home with my equipment 3 days a week.  I have problems lying down and sleeping. I do squats, bench press you name it without any problems.  Punching bag.  Go back to see spine MD dec 15.  No MRI.  Have to be seen by you (PT) first.  Go back to see Dr Debby Dec 15  PERTINENT HISTORY:  Evaluation and Treatment Land and aquatics Low back pain  PAIN:  Are you having pain? Yes: NPRS scale: low  Pain location: right Lower back at L5-s1 area Pain description: sharp pain in am, constant ache pain; crunches  Aggravating factors: lying flat; bending forward, riding lawn mower Relieving factors: stretching  PRECAUTIONS: None  RED FLAGS: None   WEIGHT BEARING RESTRICTIONS: No  FALLS:  Has patient fallen in last 6 months? Yes. Number of falls 1  LIVING ENVIRONMENT: Lives with: lives with their  spouse Lives in: House/apartment   OCCUPATION: IT  PLOF: Independent  PATIENT GOALS: tighten core, slow progression of scoliosis, do something about that crushed disc.  NEXT MD VISIT: Dec 15  OBJECTIVE:  Note: Objective measures were completed at Evaluation unless otherwise noted.  DIAGNOSTIC FINDINGS:  XRAY: L-spine x-rays personally reviewed and interpreted by me from 02/15/2024 showing: - Prominent levoscoliosis - Multilevel degenerative changes, most prominent at the L5-S1 level - No acute bony abnormalities  PATIENT SURVEYS:  ODI: 9/50=18%  COGNITION: Overall cognitive status: Within functional limits for tasks assessed     SENSATION: WFL  MUSCLE LENGTH: Hamstrings: wfl   POSTURE: Lumbar levoscoliosis  PALPATION: Minimal TTP left LB l5-S1 area  LUMBAR ROM:   Flex/ext full Lateral flex R/L full P!  LOWER EXTREMITY ROM:     WFL  LOWER EXTREMITY MMT:    MMT Right eval Left eval  Hip flexion 28.8 34.3  Hip extension    Hip abduction    Hip adduction    Hip internal rotation    Hip external rotation    Knee flexion    Knee extension    Ankle dorsiflexion    Ankle plantarflexion    Ankle inversion    Ankle eversion     (Blank rows = not tested)  LUMBAR SPECIAL TESTS:  Straight leg raise test: Negative and FABER test: Negative  FUNCTIONAL TESTS:  5 times sit to stand: 11.94 Timed up and go (TUG): 9.87  GAIT: Distance walked: no limitations, normal gait   TREATMENT  [facility] Adult PT Treatment:                                             Date: 06/04/24 Pt seen for aquatic therapy today.  Treatment took place in water 3.5-4.75 ft in depth at the [facility] pool. Temp of water was 91.  Pt entered/exited the pool via stairs with bil rail.  - unsupported walking forward/ backward with cues for vertical trunk and even step length  - unsupported side stepping -> with arm add/abdct with rainbow hand float  (yellow too much) - suitcase  carry with single  rainbow on Lt (lower back pain increased) -> bil rainbow with walking forward/backward  - TrA set with single/bil rainbow hand float pull down to thighs x 5 in standard stance -> staggered stance with solid black noodle - UE on black noodle:  single leg heel raises x10; hip add/abd x10 (cues to decrease height of LE) ; hip flexion /extension x10 - at bench in water:  plank with hip extension (cues to decrease height of LEs); side plank R/L (some pain when in Rt plank - straddling noodle and UE on wall: gentle cycle -> extra noodle under arms without support cycling  Pt requires the buoyancy and hydrostatic pressure of water for support, and to offload joints by unweighting joint load by at least 50 % in navel deep water and by at least 75-80% in chest to neck deep water.  Viscosity of the water is needed for resistance of strengthening. Water current perturbations provides challenge to standing balance requiring increased core activation.     PATIENT EDUCATION:  Education details: intro to aquatic therapy Person educated: Patient Education method: Explanation Education comprehension: verbalized understanding  HOME EXERCISE PROGRAM: Pt indep with person exercise program  AQUATIC Access Code: [email or web address] URL: [email or web address] Date: 06/04/2024 * not issued yet Prepared by: [facility] [address] [address] This aquatic home exercise program from [employer] utilizes pictures from land based exercises, but has been adapted prior to lamination and issuance.   ASSESSMENT:  CLINICAL IMPRESSION:   Pt reports sharp brief pain in Rt LB with unsteadiness; resolves gradually. Continued limited tolerance for single suitcase carry due to increased pain, but bil carry is tolerated fine.  Pain remained low throughout session; good tolerance for progression of exercise. Goals are ongoing. Will plan to issue aquatic HEP next visit.    From initial evaluation:   Patient is a 63 y.o. m who was seen today for physical therapy evaluation and treatment for LBP with a 34d levoscoliosis and lumbar spodylosis. He reports he has known about the scoliosis for years but has not had any therapy for it.  Pain for >10 years with an exacerbation over the past 3.  He is an active exerciser using his personal gym at home 3 days a week working core and LE. He reports avoiding leg lifts as his LB pops.  Has  an inversion table but doesn't use as it is painful. Main compliant is he has increased pain with lying down and sleeping.  He is able to tolerate all activities standing and sitting working through pain.  Wears a weight lifting belt when he is working out in the yard and a corset when he sleeps. Pt goals are to impair the progression of the scoliosis, tighten core and reduce pain.  He will benefit from a short episode of skilled PT with combination of aquatics and land. Aquatic for basis exercises/stretching and to instruct on using the properties of water for pain management. Land for progressive stretching and exercises program to reach stated goals.  OBJECTIVE IMPAIRMENTS: decreased strength, postural dysfunction, and pain.   ACTIVITY LIMITATIONS: sleeping  PARTICIPATION LIMITATIONS: riding lawnmower  REHAB POTENTIAL: Good  CLINICAL DECISION MAKING: Stable/uncomplicated  EVALUATION COMPLEXITY: Low   GOALS: Goals reviewed with patient? Yes  SHORT TERM GOALS: Target date: 06/09/24  Pt to be indep with aquatic exercises/stretching an positioning for pain management Baseline: Goal status: INITIAL  2.  Pt will consider gaining pool access for use of the properties of water for chronic conditions maintaining mobility  and minimizing pain. Baseline:  Goal status: INITIAL      LONG TERM GOALS: Target date: 07/20/23  Pt to improve on ODI by 4% (7/50) to demonstrate improvement in function particularly with sleeping Baseline: 9/50=18% Goal status:  INITIAL  2.  Pt will be indep with final HEP for management of progressive scoliosis Baseline:  Goal status: INITIAL  3.  Pt will report improved ability to tolerate lying for improved sleep Baseline:  Goal status: INITIAL  4.  Pt will demonstrate mindfulness of his spinal position to improve alignment encouraging improved posture. Baseline:  Goal status: INITIAL  5.  Pt will report decrease in pain by at least 50% for improved toleration to activity/quality of life and to demonstrate improved management of pain. Baseline:  Goal status: INITIAL    PLAN:  PT FREQUENCY: 1-2x/week  PT DURATION: 8 weeks   PLANNED INTERVENTIONS: 97164- PT Re-evaluation, 97750- Physical Performance Testing, 97110-Therapeutic exercises, 97530- Therapeutic activity, [id number]- Neuromuscular re-education, 97535- Self Care, 02859- Manual therapy, [id number]- Gait training, [phone]- Aquatic Therapy, [phone] (1-2 muscles), [phone] (3+ muscles)- Dry Needling, Patient/Family education, Balance training, Stair training, Taping, Joint mobilization, DME instructions, Cryotherapy, and Moist heat.  PLAN FOR NEXT SESSION: aquatic x 2-3 sessions or instruction on use of properties of water for core strengthening and pain management Land: core/hip strengthening; HEP   Delon Aquas, PTA 06/04/24 4:26 PM [facility] [address] [place], [place], [address] Phone: [phone]   Fax:  [phone]

## 2024-06-11 ENCOUNTER — Ambulatory Visit (HOSPITAL_BASED_OUTPATIENT_CLINIC_OR_DEPARTMENT_OTHER): Admitting: Physical Therapy

## 2024-06-11 ENCOUNTER — Encounter (HOSPITAL_BASED_OUTPATIENT_CLINIC_OR_DEPARTMENT_OTHER): Payer: Self-pay | Admitting: Physical Therapy

## 2024-06-11 DIAGNOSIS — R293 Abnormal posture: Secondary | ICD-10-CM | POA: Insufficient documentation

## 2024-06-11 DIAGNOSIS — R262 Difficulty in walking, not elsewhere classified: Secondary | ICD-10-CM | POA: Insufficient documentation

## 2024-06-11 DIAGNOSIS — M5459 Other low back pain: Secondary | ICD-10-CM | POA: Diagnosis present

## 2024-06-11 NOTE — Therapy (Signed)
 OUTPATIENT PHYSICAL THERAPY THORACOLUMBAR TREATMENT   Patient Name: Zachary Lara MRN: [id number] DOB:[birth year], 63 y.o., male Today's Date: 06/11/2024  END OF SESSION:  PT End of Session - 06/11/24 1536     Visit Number 4    Date for Recertification  07/19/24    PT Start Time 1530    PT Stop Time 1610    PT Time Calculation (min) 40 min    Behavior During Therapy [facility] for tasks assessed/performed           Past Medical History:  Diagnosis Date   Arthritis    Dizziness    SINCE TAKING FLUID PILL   Granulomatous hepatitis    H/O hiatal hernia    Hemorrhoids    Pericarditis    H/O, chronic calcific,normal stress cardiolite  10/20/2003 EF 52%   Pulmonary edema    CURRENTLY HAS ABNORMAL CXR - FOLLOWED BY CARDIOLOGIST AT [facility]   PVC (premature ventricular contraction)    Shortness of breath    WITH EXERTION -    SVT (supraventricular tachycardia)    Ulcer    NON-BLEEDING STOMACH ULCER   Ventricular fibrillation (HCC)    POST OP PARICARDECTOMY - NO EPISODES SINCE   Past Surgical History:  Procedure Laterality Date   BAND HEMORRHOIDECTOMY     EXCISIONAL HEMORRHOIDECTOMY     LIVER BIOPSY     LUNG SURGERY  2013   pericardial stripping  08/19/11   [facility]   TONSILLECTOMY     Patient Active Problem List   Diagnosis Date Noted   Hyperlipidemia 04/24/2024   OSA (obstructive sleep apnea) 09/30/2021   Thrombocytopenia 03/25/2021   Atopy 03/25/2021   Unilateral occipital headache 10/18/2015   Transient vision disturbance 08/27/2015   Pulsatile tinnitus 08/27/2015   New onset of headaches after age 48 08/27/2015   Numbness 08/27/2015   Eosinophilia 04/29/2014   Pericarditis    Internal bleeding hemorrhoids 10/06/2011   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   Disease of pericardium 09/16/2010   FATIGUE 09/16/2010   NONSPECIFIC ABNORMAL RESULTS LIVR FUNCTION STUDY 09/16/2010   History of colonic polyps 09/16/2010    PCP: Bernardino Boone DO  REFERRING PROVIDER:  Rainell Cedar MD  REFERRING DIAG:  M54.50,G89.29 (ICD-10-CM) - Chronic right-sided low back pain without sciatica  M41.9 (ICD-10-CM) - Scoliosis of lumbar spine, unspecified scoliosis type  M47.816 (ICD-10-CM) - Spondylosis without myelopathy or radiculopathy, lumbar region    Rationale for Evaluation and Treatment: Rehabilitation  THERAPY DIAG:  Other low back pain  Abnormal posture  Difficulty in walking, not elsewhere classified  ONSET DATE: chronic with exacerbation last few years  SUBJECTIVE:  SUBJECTIVE STATEMENT: Pt reports he is doing planks every other day (holding for 1.5 min) and next day back is more painful.  He reports he can feel that his core is tighter now.    POOL ACCESS: considering doing guest pass when wife comes to [employer] (she's a member)  From initial evaluation:  I have had back pain for years.  I have been doing exercises at home with my equipment 3 days a week.  I have problems lying down and sleeping. I do squats, bench press you name it without any problems.  Punching bag.  Go back to see spine MD dec 15.  No MRI.  Have to be seen by you (PT) first.  Go back to see Dr Debby Dec 15  PERTINENT HISTORY:  Evaluation and Treatment Land and aquatics Low back pain  PAIN:  Are you having pain? Yes: NPRS scale: 0.25/10 Pain location: right Lower back at L5-s1 area Pain description: sharp pain in am, constant ache pain; crunches  Aggravating factors: lying flat; bending forward, riding lawn mower Relieving factors: stretching  PRECAUTIONS: None  RED FLAGS: None   WEIGHT BEARING RESTRICTIONS: No  FALLS:  Has patient fallen in last 6 months? Yes. Number of falls 1  LIVING ENVIRONMENT: Lives with: lives with their spouse Lives in: House/apartment   OCCUPATION:  IT  PLOF: Independent  PATIENT GOALS: tighten core, slow progression of scoliosis, do something about that crushed disc.  NEXT MD VISIT: Dec 15  OBJECTIVE:  Note: Objective measures were completed at Evaluation unless otherwise noted.  DIAGNOSTIC FINDINGS:  XRAY: L-spine x-rays personally reviewed and interpreted by me from 02/15/2024 showing: - Prominent levoscoliosis - Multilevel degenerative changes, most prominent at the L5-S1 level - No acute bony abnormalities  PATIENT SURVEYS:  ODI: 9/50=18%  COGNITION: Overall cognitive status: Within functional limits for tasks assessed     SENSATION: WFL  MUSCLE LENGTH: Hamstrings: wfl   POSTURE: Lumbar levoscoliosis  PALPATION: Minimal TTP left LB l5-S1 area  LUMBAR ROM:   Flex/ext full Lateral flex R/L full P!  LOWER EXTREMITY ROM:     WFL  LOWER EXTREMITY MMT:    MMT Right eval Left eval  Hip flexion 28.8 34.3  Hip extension    Hip abduction    Hip adduction    Hip internal rotation    Hip external rotation    Knee flexion    Knee extension    Ankle dorsiflexion    Ankle plantarflexion    Ankle inversion    Ankle eversion     (Blank rows = not tested)  LUMBAR SPECIAL TESTS:  Straight leg raise test: Negative and FABER test: Negative  FUNCTIONAL TESTS:  5 times sit to stand: 11.94 Timed up and go (TUG): 9.87  GAIT: Distance walked: no limitations, normal gait   TREATMENT  [facility] Adult PT Treatment:                                             Date: 06/04/24 Pt seen for aquatic therapy today.  Treatment took place in water 3.5-4.75 ft in depth at the [facility] pool. Temp of water was 91.  Pt entered/exited the pool via stairs with bil rail.  - unsupported walking forward/ backward with cues for vertical trunk and even step length  - unsupported side stepping -> with arm add/abdct with rainbow hand float ->  yellow (improved tolerance) - TrA set with solid black noodle pull down to thighs  in staggered stance  - UE on black noodle:   hip add/abd x10 (cues to decrease height of LE) ; hip flexion /extension x10 - at bench in water: side plank R/L  - superman to/from plank on yellow hand floats;  flies to/from push ups in plank position with yellow hand floats - wall push up and off x 15  Pt requires the buoyancy and hydrostatic pressure of water for support, and to offload joints by unweighting joint load by at least 50 % in navel deep water and by at least 75-80% in chest to neck deep water.  Viscosity of the water is needed for resistance of strengthening. Water current perturbations provides challenge to standing balance requiring increased core activation.     PATIENT EDUCATION:  Education details: intro to aquatic therapy Person educated: Patient Education method: Explanation Education comprehension: verbalized understanding  HOME EXERCISE PROGRAM: Pt indep with person exercise program  AQUATIC Access Code: [email or web address] URL: [email or web address] Date: 06/04/2024  Prepared by: [facility] - [facility] This aquatic home exercise program from MedBridge utilizes pictures from land based exercises, but has been adapted prior to lamination and issuance.   ASSESSMENT:  CLINICAL IMPRESSION:   Improved tolerance for increased resistance with side stepping with arm add/abdct with yellow hand floats.  Pain remained low throughout session; good tolerance for progression of exercise. Pt has met STG1.  He was issued laminated aquatic HEP.  Pt to transition to land based exercise next visit.     From initial evaluation:  Patient is a 63 y.o. m who was seen today for physical therapy evaluation and treatment for LBP with a 34d levoscoliosis and lumbar spodylosis. He reports he has known about the scoliosis for years but has not had any therapy for it.  Pain for >10 years with an exacerbation over the past 3.  He is an active exerciser using his personal  gym at home 3 days a week working core and LE. He reports avoiding leg lifts as his LB pops.  Has  an inversion table but doesn't use as it is painful. Main compliant is he has increased pain with lying down and sleeping.  He is able to tolerate all activities standing and sitting working through pain.  Wears a weight lifting belt when he is working out in the yard and a corset when he sleeps. Pt goals are to impair the progression of the scoliosis, tighten core and reduce pain.  He will benefit from a short episode of skilled PT with combination of aquatics and land. Aquatic for basis exercises/stretching and to instruct on using the properties of water for pain management. Land for progressive stretching and exercises program to reach stated goals.  OBJECTIVE IMPAIRMENTS: decreased strength, postural dysfunction, and pain.   ACTIVITY LIMITATIONS: sleeping  PARTICIPATION LIMITATIONS: riding lawnmower  REHAB POTENTIAL: Good  CLINICAL DECISION MAKING: Stable/uncomplicated  EVALUATION COMPLEXITY: Low   GOALS: Goals reviewed with patient? Yes  SHORT TERM GOALS: Target date: 06/09/24  Pt to be indep with aquatic exercises/stretching an positioning for pain management Baseline: Goal status: MET - 06/11/24  2.  Pt will consider gaining pool access for use of the properties of water for chronic conditions maintaining mobility and minimizing pain. Baseline:  Goal status:In progress - 06/11/24      LONG TERM GOALS: Target date: 07/20/23  Pt to improve on ODI by 4% (7/50) to demonstrate  improvement in function particularly with sleeping Baseline: 9/50=18% Goal status: INITIAL  2.  Pt will be indep with final HEP for management of progressive scoliosis Baseline:  Goal status: INITIAL  3.  Pt will report improved ability to tolerate lying for improved sleep Baseline:  Goal status: INITIAL  4.  Pt will demonstrate mindfulness of his spinal position to improve alignment encouraging  improved posture. Baseline:  Goal status: INITIAL  5.  Pt will report decrease in pain by at least 50% for improved toleration to activity/quality of life and to demonstrate improved management of pain. Baseline:  Goal status: INITIAL    PLAN:  PT FREQUENCY: 1-2x/week  PT DURATION: 8 weeks   PLANNED INTERVENTIONS: 97164- PT Re-evaluation, 97750- Physical Performance Testing, 97110-Therapeutic exercises, 97530- Therapeutic activity, [id number]- Neuromuscular re-education, 97535- Self Care, [id number]- Manual therapy, [id number]- Gait training, [phone]- Aquatic Therapy, [phone] (1-2 muscles), [phone] (3+ muscles)- Dry Needling, Patient/Family education, Balance training, Stair training, Taping, Joint mobilization, DME instructions, Cryotherapy, and Moist heat.  PLAN FOR NEXT SESSION:  Land: core/hip strengthening; HEP   Delon Aquas, PTA 06/11/24 4:19 PM [facility] [address] [place], [place], [address] Phone: [phone]   Fax:  [phone]

## 2024-06-13 ENCOUNTER — Other Ambulatory Visit (HOSPITAL_COMMUNITY): Payer: Self-pay | Admitting: Family Medicine

## 2024-06-13 ENCOUNTER — Encounter (HOSPITAL_BASED_OUTPATIENT_CLINIC_OR_DEPARTMENT_OTHER): Payer: Self-pay | Admitting: Physical Therapy

## 2024-06-13 ENCOUNTER — Ambulatory Visit (HOSPITAL_BASED_OUTPATIENT_CLINIC_OR_DEPARTMENT_OTHER): Admitting: Physical Therapy

## 2024-06-13 DIAGNOSIS — R262 Difficulty in walking, not elsewhere classified: Secondary | ICD-10-CM

## 2024-06-13 DIAGNOSIS — M5459 Other low back pain: Secondary | ICD-10-CM

## 2024-06-13 DIAGNOSIS — R293 Abnormal posture: Secondary | ICD-10-CM

## 2024-06-13 DIAGNOSIS — R61 Generalized hyperhidrosis: Secondary | ICD-10-CM

## 2024-06-13 NOTE — Therapy (Signed)
 OUTPATIENT PHYSICAL THERAPY THORACOLUMBAR TREATMENT   Patient Name: Zachary Lara MRN: [id number] DOB:[birth year], 63 y.o., male Today's Date: 06/13/2024  END OF SESSION:  PT End of Session - 06/13/24 1441     Visit Number 5    Date for Recertification  07/19/24    Authorization Type BC[employer]    PT Start Time 1433    PT Stop Time 1512    PT Time Calculation (min) 39 min    Activity Tolerance Patient tolerated treatment well    Behavior During Therapy WFL for tasks assessed/performed            Past Medical History:  Diagnosis Date   Arthritis    Dizziness    SINCE TAKING FLUID PILL   Granulomatous hepatitis    H/O hiatal hernia    Hemorrhoids    Pericarditis    H/O, chronic calcific,normal stress cardiolite  10/20/2003 EF 52%   Pulmonary edema    CURRENTLY HAS ABNORMAL CXR - FOLLOWED BY CARDIOLOGIST AT [facility]   PVC (premature ventricular contraction)    Shortness of breath    WITH EXERTION -    SVT (supraventricular tachycardia)    Ulcer    NON-BLEEDING STOMACH ULCER   Ventricular fibrillation (HCC)    POST OP PARICARDECTOMY - NO EPISODES SINCE   Past Surgical History:  Procedure Laterality Date   BAND HEMORRHOIDECTOMY     EXCISIONAL HEMORRHOIDECTOMY     LIVER BIOPSY     LUNG SURGERY  2013   pericardial stripping  08/19/11   [facility]   TONSILLECTOMY     Patient Active Problem List   Diagnosis Date Noted   Hyperlipidemia 04/24/2024   OSA (obstructive sleep apnea) 09/30/2021   Thrombocytopenia 03/25/2021   Atopy 03/25/2021   Unilateral occipital headache 10/18/2015   Transient vision disturbance 08/27/2015   Pulsatile tinnitus 08/27/2015   New onset of headaches after age 26 08/27/2015   Numbness 08/27/2015   Eosinophilia 04/29/2014   Pericarditis    Internal bleeding hemorrhoids 10/06/2011   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   Disease of pericardium 09/16/2010   FATIGUE 09/16/2010   NONSPECIFIC ABNORMAL RESULTS LIVR FUNCTION STUDY 09/16/2010    History of colonic polyps 09/16/2010    PCP: Bernardino Boone DO  REFERRING PROVIDER: Rainell Cedar MD  REFERRING DIAG:  M54.50,G89.29 (ICD-10-CM) - Chronic right-sided low back pain without sciatica  M41.9 (ICD-10-CM) - Scoliosis of lumbar spine, unspecified scoliosis type  M47.816 (ICD-10-CM) - Spondylosis without myelopathy or radiculopathy, lumbar region    Rationale for Evaluation and Treatment: Rehabilitation  THERAPY DIAG:  Other low back pain  Abnormal posture  Difficulty in walking, not elsewhere classified  ONSET DATE: chronic with exacerbation last few years  SUBJECTIVE:  SUBJECTIVE STATEMENT:  Trying to build up my core, in the past two years the curve in my back has increased more. Having CT scan done tomorrow.  Purchased a belt that has heat, vibration, and air massage that really helps in the mornings.   POOL ACCESS: considering doing guest pass when wife comes to [employer] (she's a member)  From initial evaluation:  I have had back pain for years.  I have been doing exercises at home with my equipment 3 days a week.  I have problems lying down and sleeping. I do squats, bench press you name it without any problems.  Punching bag.  Go back to see spine MD dec 15.  No MRI.  Have to be seen by you (PT) first.  Go back to see Dr Debby Dec 15  PERTINENT HISTORY:  Evaluation and Treatment Land and aquatics Low back pain  PAIN:  Are you having pain? No: NPRS scale: 0/10 Pain location:  Pain description:  Aggravating factors:  Relieving factors:   PRECAUTIONS: None  RED FLAGS: None   WEIGHT BEARING RESTRICTIONS: No  FALLS:  Has patient fallen in last 6 months? Yes. Number of falls 1  LIVING ENVIRONMENT: Lives with: lives with their spouse Lives in:  House/apartment   OCCUPATION: IT  PLOF: Independent  PATIENT GOALS: tighten core, slow progression of scoliosis, do something about that crushed disc.  NEXT MD VISIT: Dec 15  OBJECTIVE:  Note: Objective measures were completed at Evaluation unless otherwise noted.  DIAGNOSTIC FINDINGS:  XRAY: L-spine x-rays personally reviewed and interpreted by me from 02/15/2024 showing: - Prominent levoscoliosis - Multilevel degenerative changes, most prominent at the L5-S1 level - No acute bony abnormalities  PATIENT SURVEYS:  ODI: 9/50=18%  COGNITION: Overall cognitive status: Within functional limits for tasks assessed     SENSATION: WFL  MUSCLE LENGTH: Hamstrings: wfl   POSTURE: Lumbar levoscoliosis  PALPATION: Minimal TTP left LB l5-S1 area  LUMBAR ROM:   Flex/ext full Lateral flex R/L full P!  LOWER EXTREMITY ROM:     WFL  LOWER EXTREMITY MMT:    MMT Right eval Left eval  Hip flexion 28.8 34.3  Hip extension    Hip abduction    Hip adduction    Hip internal rotation    Hip external rotation    Knee flexion    Knee extension    Ankle dorsiflexion    Ankle plantarflexion    Ankle inversion    Ankle eversion     (Blank rows = not tested)  LUMBAR SPECIAL TESTS:  Straight leg raise test: Negative and FABER test: Negative  FUNCTIONAL TESTS:  5 times sit to stand: 11.94 Timed up and go (TUG): 9.87  GAIT: Distance walked: no limitations, normal gait   TREATMENT  [facility] Adult PT Treatment:                                             Date:  06/13/24  Nustep L4x6 minutes BLEs only for w/u   PPT x12 cues to avoid [place] x12  Figure 4 stretch 2x30 seconds B  Seated TA set + march x10 B Double checked plank form- generally looked good  QL stretch 3 way x30 seconds each  Demonstrated and educated on tennis ball MFR release on wall       06/04/24 Pt seen for aquatic therapy today.  Treatment took place in water 3.5-4.75 ft in depth at the  [facility] pool. Temp of water was 91.  Pt entered/exited the pool via stairs with bil rail.  - unsupported walking forward/ backward with cues for vertical trunk and even step length  - unsupported side stepping -> with arm add/abdct with rainbow hand float ->yellow (improved tolerance) - TrA set with solid black noodle pull down to thighs in staggered stance  - UE on black noodle:   hip add/abd x10 (cues to decrease height of LE) ; hip flexion /extension x10 - at bench in water: side plank R/L  - superman to/from plank on yellow hand floats;  flies to/from push ups in plank position with yellow hand floats - wall push up and off x 15  Pt requires the buoyancy and hydrostatic pressure of water for support, and to offload joints by unweighting joint load by at least 50 % in navel deep water and by at least 75-80% in chest to neck deep water.  Viscosity of the water is needed for resistance of strengthening. Water current perturbations provides challenge to standing balance requiring increased core activation.     PATIENT EDUCATION:  Education details: intro to aquatic therapy Person educated: Patient Education method: Explanation Education comprehension: verbalized understanding  HOME EXERCISE PROGRAM: Pt indep with person exercise program  AQUATIC Access Code: [email or web address] URL: [email or web address] Date: 06/04/2024  Prepared by: [facility] - [facility] - [facility] This aquatic home exercise program from [employer] utilizes pictures from land based exercises, but has been adapted prior to lamination and issuance.    Access Code: [email or web address] URL: [email or web address] Date: 06/13/2024 Prepared by: Josette Rough  Exercises - Clamshell with Resistance  - 1 x daily - 5 x weekly - 1-2 sets - 10 reps - Supine Posterior Pelvic Tilt  - 1 x daily - 5 x weekly - 1-2 sets - 10 reps - Supine Figure 4 Piriformis Stretch  - 1 x daily - 5 x weekly - 1-2  sets - 4 reps - 30 seconds  hold - TA Bracing + March   - 1 x daily - 5 x weekly - 1-2 sets - 10 reps  ASSESSMENT:  CLINICAL IMPRESSION:    Arrives today doing OK, this was his first land visit so we took everything step by step to ensure good land based exercise tolerance. Also gave initial land based HEP today. Able to complete all interventions without increased pain. Will continue to challenge as able.     From initial evaluation:  Patient is a 63 y.o. m who was seen today for physical therapy evaluation and treatment for LBP with a 34d levoscoliosis and lumbar spodylosis. He reports he has known about the scoliosis for years but has not had any therapy for it.  Pain for >10 years with an exacerbation over the past 3.  He is an active exerciser using his personal gym at home 3 days a week working core and LE. He reports avoiding leg lifts as his LB pops.  Has  an inversion table but doesn't use as it is painful. Main compliant is he has increased pain with lying down and sleeping.  He is able to tolerate all activities standing and sitting working through pain.  Wears a weight lifting belt when he is working out in the yard and a corset when he sleeps. Pt goals are to impair the progression of the scoliosis, tighten core and reduce pain.  He will benefit from a short  episode of skilled PT with combination of aquatics and land. Aquatic for basis exercises/stretching and to instruct on using the properties of water for pain management. Land for progressive stretching and exercises program to reach stated goals.  OBJECTIVE IMPAIRMENTS: decreased strength, postural dysfunction, and pain.   ACTIVITY LIMITATIONS: sleeping  PARTICIPATION LIMITATIONS: riding lawnmower  REHAB POTENTIAL: Good  CLINICAL DECISION MAKING: Stable/uncomplicated  EVALUATION COMPLEXITY: Low   GOALS: Goals reviewed with patient? Yes  SHORT TERM GOALS: Target date: 06/09/24  Pt to be indep with aquatic  exercises/stretching an positioning for pain management Baseline: Goal status: MET - 06/11/24  2.  Pt will consider gaining pool access for use of the properties of water for chronic conditions maintaining mobility and minimizing pain. Baseline:  Goal status:In progress - 06/11/24      LONG TERM GOALS: Target date: 07/20/23  Pt to improve on ODI by 4% (7/50) to demonstrate improvement in function particularly with sleeping Baseline: 9/50=18% Goal status: INITIAL  2.  Pt will be indep with final HEP for management of progressive scoliosis Baseline:  Goal status: INITIAL  3.  Pt will report improved ability to tolerate lying for improved sleep Baseline:  Goal status: INITIAL  4.  Pt will demonstrate mindfulness of his spinal position to improve alignment encouraging improved posture. Baseline:  Goal status: INITIAL  5.  Pt will report decrease in pain by at least 50% for improved toleration to activity/quality of life and to demonstrate improved management of pain. Baseline:  Goal status: INITIAL    PLAN:  PT FREQUENCY: 1-2x/week  PT DURATION: 8 weeks   PLANNED INTERVENTIONS: 97164- PT Re-evaluation, 97750- Physical Performance Testing, 97110-Therapeutic exercises, 97530- Therapeutic activity, [id number]- Neuromuscular re-education, 97535- Self Care, 02859- Manual therapy, [id number]- Gait training, [phone]- Aquatic Therapy, [phone] (1-2 muscles), 20561 (3+ muscles)- Dry Needling, Patient/Family education, Balance training, Stair training, Taping, Joint mobilization, DME instructions, Cryotherapy, and Moist heat.  PLAN FOR NEXT SESSION:  Land: core/hip strengthening; HEP. Sees spine MD on the 15th, plan on updating measures 06/20/24   Josette Rough, PT, DPT 06/13/24 3:13 PM

## 2024-06-14 ENCOUNTER — Ambulatory Visit (HOSPITAL_BASED_OUTPATIENT_CLINIC_OR_DEPARTMENT_OTHER): Admission: RE | Admit: 2024-06-14 | Discharge: 2024-06-14 | Attending: Family Medicine

## 2024-06-14 DIAGNOSIS — R61 Generalized hyperhidrosis: Secondary | ICD-10-CM

## 2024-06-14 LAB — POCT I-STAT CREATININE: Creatinine, Ser: 1.1 mg/dL (ref 0.61–1.24)

## 2024-06-14 MED ORDER — IOHEXOL 300 MG/ML  SOLN
75.0000 mL | Freq: Once | INTRAMUSCULAR | Status: AC | PRN
  Administered 2024-06-14: 100 mL via INTRAVENOUS

## 2024-06-17 ENCOUNTER — Ambulatory Visit (HOSPITAL_BASED_OUTPATIENT_CLINIC_OR_DEPARTMENT_OTHER): Admitting: Physical Therapy

## 2024-06-17 ENCOUNTER — Encounter (HOSPITAL_BASED_OUTPATIENT_CLINIC_OR_DEPARTMENT_OTHER): Payer: Self-pay | Admitting: Physical Therapy

## 2024-06-17 DIAGNOSIS — M5459 Other low back pain: Secondary | ICD-10-CM

## 2024-06-17 DIAGNOSIS — R293 Abnormal posture: Secondary | ICD-10-CM

## 2024-06-17 DIAGNOSIS — R262 Difficulty in walking, not elsewhere classified: Secondary | ICD-10-CM

## 2024-06-17 NOTE — Therapy (Signed)
 OUTPATIENT PHYSICAL THERAPY THORACOLUMBAR TREATMENT   Patient Name: Zachary Lara MRN: [id number] DOB:1961-05-21, 63 y.o., male Today's Date: 06/17/2024  END OF SESSION:  PT End of Session - 06/17/24 1440     Visit Number 6    Date for Recertification  07/19/24    Authorization Type BCBS    PT Start Time 1432    PT Stop Time 1510    PT Time Calculation (min) 38 min    Activity Tolerance Patient tolerated treatment well    Behavior During Therapy WFL for tasks assessed/performed             Past Medical History:  Diagnosis Date   Arthritis    Dizziness    SINCE TAKING FLUID PILL   Granulomatous hepatitis    H/O hiatal hernia    Hemorrhoids    Pericarditis    H/O, chronic calcific,normal stress cardiolite  10/20/2003 EF 52%   Pulmonary edema    CURRENTLY HAS ABNORMAL CXR - FOLLOWED BY CARDIOLOGIST AT [facility]   PVC (premature ventricular contraction)    Shortness of breath    WITH EXERTION -    SVT (supraventricular tachycardia)    Ulcer    NON-BLEEDING STOMACH ULCER   Ventricular fibrillation (HCC)    POST OP PARICARDECTOMY - NO EPISODES SINCE   Past Surgical History:  Procedure Laterality Date   BAND HEMORRHOIDECTOMY     EXCISIONAL HEMORRHOIDECTOMY     LIVER BIOPSY     LUNG SURGERY  2013   pericardial stripping  08/19/11   [facility]   TONSILLECTOMY     Patient Active Problem List   Diagnosis Date Noted   Hyperlipidemia 04/24/2024   OSA (obstructive sleep apnea) 09/30/2021   Thrombocytopenia 03/25/2021   Atopy 03/25/2021   Unilateral occipital headache 10/18/2015   Transient vision disturbance 08/27/2015   Pulsatile tinnitus 08/27/2015   New onset of headaches after age 64 08/27/2015   Numbness 08/27/2015   Eosinophilia 04/29/2014   Pericarditis    Internal bleeding hemorrhoids 10/06/2011   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   Disease of pericardium 09/16/2010   FATIGUE 09/16/2010   NONSPECIFIC ABNORMAL RESULTS LIVR FUNCTION STUDY 09/16/2010    History of colonic polyps 09/16/2010    PCP: Bernardino Boone DO  REFERRING PROVIDER: Rainell Cedar MD  REFERRING DIAG:  M54.50,G89.29 (ICD-10-CM) - Chronic right-sided low back pain without sciatica  M41.9 (ICD-10-CM) - Scoliosis of lumbar spine, unspecified scoliosis type  M47.816 (ICD-10-CM) - Spondylosis without myelopathy or radiculopathy, lumbar region    Rationale for Evaluation and Treatment: Rehabilitation  THERAPY DIAG:  Other low back pain  Abnormal posture  Difficulty in walking, not elsewhere classified  ONSET DATE: chronic with exacerbation last few years  SUBJECTIVE:  SUBJECTIVE STATEMENT:  Had a lot of bad pain after last visit but that's normal for me after exercise in general. Still using that belt in the morning   POOL ACCESS: considering doing guest pass when wife comes to [employer] (she's a member)  From initial evaluation:  I have had back pain for years.  I have been doing exercises at home with my equipment 3 days a week.  I have problems lying down and sleeping. I do squats, bench press you name it without any problems.  Punching bag.  Go back to see spine MD dec 15.  No MRI.  Have to be seen by you (PT) first.  Go back to see Dr Debby Dec 15  PERTINENT HISTORY:  Evaluation and Treatment Land and aquatics Low back pain  PAIN:  Are you having pain? No: NPRS scale: 0/10 Pain location:  Pain description:  Aggravating factors:  Relieving factors:   PRECAUTIONS: None  RED FLAGS: None   WEIGHT BEARING RESTRICTIONS: No  FALLS:  Has patient fallen in last 6 months? Yes. Number of falls 1  LIVING ENVIRONMENT: Lives with: lives with their spouse Lives in: House/apartment   OCCUPATION: IT  PLOF: Independent  PATIENT GOALS: tighten core, slow progression of  scoliosis, do something about that crushed disc.  NEXT MD VISIT: Dec 15  OBJECTIVE:  Note: Objective measures were completed at Evaluation unless otherwise noted.  DIAGNOSTIC FINDINGS:  XRAY: L-spine x-rays personally reviewed and interpreted by me from 02/15/2024 showing: - Prominent levoscoliosis - Multilevel degenerative changes, most prominent at the L5-S1 level - No acute bony abnormalities  PATIENT SURVEYS:  ODI: 9/50=18%  COGNITION: Overall cognitive status: Within functional limits for tasks assessed     SENSATION: WFL  MUSCLE LENGTH: Hamstrings: wfl   POSTURE: Lumbar levoscoliosis  PALPATION: Minimal TTP left LB l5-S1 area  LUMBAR ROM:   Flex/ext full Lateral flex R/L full P!  LOWER EXTREMITY ROM:     WFL  LOWER EXTREMITY MMT:    MMT Right eval Left eval  Hip flexion 28.8 34.3  Hip extension    Hip abduction    Hip adduction    Hip internal rotation    Hip external rotation    Knee flexion    Knee extension    Ankle dorsiflexion    Ankle plantarflexion    Ankle inversion    Ankle eversion     (Blank rows = not tested)  LUMBAR SPECIAL TESTS:  Straight leg raise test: Negative and FABER test: Negative  FUNCTIONAL TESTS:  5 times sit to stand: 11.94 Timed up and go (TUG): 9.87  GAIT: Distance walked: no limitations, normal gait   TREATMENT  [facility] Adult PT Treatment:                                             Date:   06/17/24  Nustep L4x6 minutes BLEs only for warm up, seat 9  Lumbar rotation stretch 5x3 seconds B DKTC 2x20 seconds Figure 4 stretch 1x40 seconds B Hooklying TA set 10x3 seconds  Hooklying alternating UE/LE flexion x20 + TA set HS stretch 1x30 seconds B Hip hikes x10 B (but painful) L-stretch at counter 3x30 seconds  Green TB rows + TA set x12 Green TB shoulder extensions + TA set x12     06/13/24  Nustep L4x6 minutes BLEs only for w/u  PPT x12 cues to avoid arch Bridge x12  Figure 4 stretch 2x30  seconds B  Seated TA set + march x10 B Double checked plank form- generally looked good  QL stretch 3 way x30 seconds each  Demonstrated and educated on tennis ball MFR release on wall       06/04/24 Pt seen for aquatic therapy today.  Treatment took place in water 3.5-4.75 ft in depth at the [facility] pool. Temp of water was 91.  Pt entered/exited the pool via stairs with bil rail.  - unsupported walking forward/ backward with cues for vertical trunk and even step length  - unsupported side stepping -> with arm add/abdct with rainbow hand float ->yellow (improved tolerance) - TrA set with solid black noodle pull down to thighs in staggered stance  - UE on black noodle:   hip add/abd x10 (cues to decrease height of LE) ; hip flexion /extension x10 - at bench in water: side plank R/L  - superman to/from plank on yellow hand floats;  flies to/from push ups in plank position with yellow hand floats - wall push up and off x 15  Pt requires the buoyancy and hydrostatic pressure of water for support, and to offload joints by unweighting joint load by at least 50 % in navel deep water and by at least 75-80% in chest to neck deep water.  Viscosity of the water is needed for resistance of strengthening. Water current perturbations provides challenge to standing balance requiring increased core activation.     PATIENT EDUCATION:  Education details: intro to aquatic therapy Person educated: Patient Education method: Explanation Education comprehension: verbalized understanding  HOME EXERCISE PROGRAM: Pt indep with person exercise program  AQUATIC Access Code: [email or web address] URL: [email or web address] Date: 06/04/2024  Prepared by: [facility] - [facility] - [address] This aquatic home exercise program from [employer] utilizes pictures from land based exercises, but has been adapted prior to lamination and issuance.    Access Code: [email or web address] URL:  [email or web address] Date: 06/13/2024 Prepared by: Josette Rough  Exercises - Clamshell with Resistance  - 1 x daily - 5 x weekly - 1-2 sets - 10 reps - Supine Posterior Pelvic Tilt  - 1 x daily - 5 x weekly - 1-2 sets - 10 reps - Supine Figure 4 Piriformis Stretch  - 1 x daily - 5 x weekly - 1-2 sets - 4 reps - 30 seconds  hold - TA Bracing + March   - 1 x daily - 5 x weekly - 1-2 sets - 10 reps  ASSESSMENT:  CLINICAL IMPRESSION:    Arrives today doing OK, unfortunately we did start some pain after last PT session but he reports this is typical. He does tend to push through pain here and also in personal workout program, educated that there is beneficial pain vs non-productive pain when it comes to exercise. So far it has been hard for him to tolerate land based sessions, will continue efforts- may be getting MRI soon.     From initial evaluation:  Patient is a 63 y.o. m who was seen today for physical therapy evaluation and treatment for LBP with a 34d levoscoliosis and lumbar spodylosis. He reports he has known about the scoliosis for years but has not had any therapy for it.  Pain for >10 years with an exacerbation over the past 3.  He is an active exerciser using his personal gym at home 3 days a week working core and LE. He reports avoiding  leg lifts as his LB pops.  Has  an inversion table but doesn't use as it is painful. Main compliant is he has increased pain with lying down and sleeping.  He is able to tolerate all activities standing and sitting working through pain.  Wears a weight lifting belt when he is working out in the yard and a corset when he sleeps. Pt goals are to impair the progression of the scoliosis, tighten core and reduce pain.  He will benefit from a short episode of skilled PT with combination of aquatics and land. Aquatic for basis exercises/stretching and to instruct on using the properties of water for pain management. Land for progressive  stretching and exercises program to reach stated goals.  OBJECTIVE IMPAIRMENTS: decreased strength, postural dysfunction, and pain.   ACTIVITY LIMITATIONS: sleeping  PARTICIPATION LIMITATIONS: riding lawnmower  REHAB POTENTIAL: Good  CLINICAL DECISION MAKING: Stable/uncomplicated  EVALUATION COMPLEXITY: Low   GOALS: Goals reviewed with patient? Yes  SHORT TERM GOALS: Target date: 06/09/24  Pt to be indep with aquatic exercises/stretching an positioning for pain management Baseline: Goal status: MET - 06/11/24  2.  Pt will consider gaining pool access for use of the properties of water for chronic conditions maintaining mobility and minimizing pain. Baseline:  Goal status:In progress - 06/11/24      LONG TERM GOALS: Target date: 07/20/23  Pt to improve on ODI by 4% (7/50) to demonstrate improvement in function particularly with sleeping Baseline: 9/50=18% Goal status: INITIAL  2.  Pt will be indep with final HEP for management of progressive scoliosis Baseline:  Goal status: INITIAL  3.  Pt will report improved ability to tolerate lying for improved sleep Baseline:  Goal status: INITIAL  4.  Pt will demonstrate mindfulness of his spinal position to improve alignment encouraging improved posture. Baseline:  Goal status: INITIAL  5.  Pt will report decrease in pain by at least 50% for improved toleration to activity/quality of life and to demonstrate improved management of pain. Baseline:  Goal status: INITIAL    PLAN:  PT FREQUENCY: 1-2x/week  PT DURATION: 8 weeks   PLANNED INTERVENTIONS: 97164- PT Re-evaluation, 97750- Physical Performance Testing, 97110-Therapeutic exercises, 97530- Therapeutic activity, [id number]- Neuromuscular re-education, 97535- Self Care, 02859- Manual therapy, [id number]- Gait training, [phone]- Aquatic Therapy, [phone] (1-2 muscles), [phone] (3+ muscles)- Dry Needling, Patient/Family education, Balance training, Stair training, Taping, Joint  mobilization, DME instructions, Cryotherapy, and Moist heat.  PLAN FOR NEXT SESSION:  Land: core/hip strengthening; HEP. Sees spine MD on the 15th, update measures 06/20/24   Josette Rough, PT, DPT 06/17/24 3:10 PM

## 2024-06-20 ENCOUNTER — Encounter (HOSPITAL_BASED_OUTPATIENT_CLINIC_OR_DEPARTMENT_OTHER): Payer: Self-pay | Admitting: Physical Therapy

## 2024-06-20 ENCOUNTER — Ambulatory Visit (HOSPITAL_BASED_OUTPATIENT_CLINIC_OR_DEPARTMENT_OTHER): Admitting: Physical Therapy

## 2024-06-20 DIAGNOSIS — M5459 Other low back pain: Secondary | ICD-10-CM | POA: Diagnosis not present

## 2024-06-20 DIAGNOSIS — R293 Abnormal posture: Secondary | ICD-10-CM

## 2024-06-20 DIAGNOSIS — R262 Difficulty in walking, not elsewhere classified: Secondary | ICD-10-CM

## 2024-06-20 NOTE — Therapy (Signed)
 OUTPATIENT PHYSICAL THERAPY THORACOLUMBAR TREATMENT   Patient Name: Zachary Lara MRN: [id number] DOB:Aug 12, 1960, 63 y.o., male Today's Date: 06/20/2024  END OF SESSION:  PT End of Session - 06/20/24 1450     Visit Number 7    Date for Recertification  07/19/24    Authorization Type BCBS    PT Start Time 1430    PT Stop Time 1458   pt politely declined exercise today   PT Time Calculation (min) 28 min    Activity Tolerance Patient tolerated treatment well    Behavior During Therapy WFL for tasks assessed/performed              Past Medical History:  Diagnosis Date   Arthritis    Dizziness    SINCE TAKING FLUID PILL   Granulomatous hepatitis    H/O hiatal hernia    Hemorrhoids    Pericarditis    H/O, chronic calcific,normal stress cardiolite  10/20/2003 EF 52%   Pulmonary edema    CURRENTLY HAS ABNORMAL CXR - FOLLOWED BY CARDIOLOGIST AT [facility]   PVC (premature ventricular contraction)    Shortness of breath    WITH EXERTION -    SVT (supraventricular tachycardia)    Ulcer    NON-BLEEDING STOMACH ULCER   Ventricular fibrillation (HCC)    POST OP PARICARDECTOMY - NO EPISODES SINCE   Past Surgical History:  Procedure Laterality Date   BAND HEMORRHOIDECTOMY     EXCISIONAL HEMORRHOIDECTOMY     LIVER BIOPSY     LUNG SURGERY  2013   pericardial stripping  08/19/11   [facility]   TONSILLECTOMY     Patient Active Problem List   Diagnosis Date Noted   Hyperlipidemia 04/24/2024   OSA (obstructive sleep apnea) 09/30/2021   Thrombocytopenia 03/25/2021   Atopy 03/25/2021   Unilateral occipital headache 10/18/2015   Transient vision disturbance 08/27/2015   Pulsatile tinnitus 08/27/2015   New onset of headaches after age 87 08/27/2015   Numbness 08/27/2015   Eosinophilia 04/29/2014   Pericarditis    Internal bleeding hemorrhoids 10/06/2011   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   Disease of pericardium 09/16/2010   FATIGUE 09/16/2010   NONSPECIFIC ABNORMAL  RESULTS LIVR FUNCTION STUDY 09/16/2010   History of colonic polyps 09/16/2010    PCP: Bernardino Boone DO  REFERRING PROVIDER: Rainell Cedar MD  REFERRING DIAG:  M54.50,G89.29 (ICD-10-CM) - Chronic right-sided low back pain without sciatica  M41.9 (ICD-10-CM) - Scoliosis of lumbar spine, unspecified scoliosis type  M47.816 (ICD-10-CM) - Spondylosis without myelopathy or radiculopathy, lumbar region    Rationale for Evaluation and Treatment: Rehabilitation  THERAPY DIAG:  Other low back pain  Abnormal posture  Difficulty in walking, not elsewhere classified  ONSET DATE: chronic with exacerbation last few years  SUBJECTIVE:  SUBJECTIVE STATEMENT:  I brought my belts for you to look at. I can't do the knees side to side again, it caused a lot of pain. Everything else from last visit felt OK. Was up a lot of steps at work today, [employer] guys have been working on something for us  today and I had to check on them. Doctor cancelled my appt, need to reschedule it.     POOL ACCESS: considering doing guest pass when wife comes to [employer] (she's a member)  From initial evaluation:  I have had back pain for years.  I have been doing exercises at home with my equipment 3 days a week.  I have problems lying down and sleeping. I do squats, bench press you name it without any problems.  Punching bag.  Go back to see spine MD dec 15.  No MRI.  Have to be seen by you (PT) first.  Go back to see Dr Debby Dec 15  PERTINENT HISTORY:  Evaluation and Treatment Land and aquatics Low back pain  PAIN:  Are you having pain? No: NPRS scale: 1/10 Pain location: across low back Pain description: sharp  Aggravating factors: lumbar rotations, bouncing up and down/impact activities like jump rope  Relieving factors: massage  belt or weight belt for working in the yard  PRECAUTIONS: None  RED FLAGS: None   WEIGHT BEARING RESTRICTIONS: No  FALLS:  Has patient fallen in last 6 months? Yes. Number of falls 1  LIVING ENVIRONMENT: Lives with: lives with their spouse Lives in: House/apartment   OCCUPATION: IT  PLOF: Independent  PATIENT GOALS: tighten core, slow progression of scoliosis, do something about that crushed disc.  NEXT MD VISIT: Dec 15  OBJECTIVE:  Note: Objective measures were completed at Evaluation unless otherwise noted.  DIAGNOSTIC FINDINGS:  XRAY: L-spine x-rays personally reviewed and interpreted by me from 02/15/2024 showing: - Prominent levoscoliosis - Multilevel degenerative changes, most prominent at the L5-S1 level - No acute bony abnormalities  PATIENT SURVEYS:  ODI: 9/50=18%                       ODI 4/50, or 8% (06/20/24) COGNITION: Overall cognitive status: Within functional limits for tasks assessed     SENSATION: WFL  MUSCLE LENGTH: Hamstrings: wfl   POSTURE: Lumbar levoscoliosis  PALPATION: Minimal TTP left LB l5-S1 area  LUMBAR ROM:   Flex/ext full Lateral flex R/L full P!  LOWER EXTREMITY ROM:     WFL  LOWER EXTREMITY MMT:    MMT Right eval Left eval Right 06/20/24 Left 06/20/24  Hip flexion 28.8 34.3 4+ 4+  Hip extension      Hip abduction      Hip adduction      Hip internal rotation      Hip external rotation      Knee flexion      Knee extension      Ankle dorsiflexion      Ankle plantarflexion      Ankle inversion      Ankle eversion       (Blank rows = not tested)  LUMBAR SPECIAL TESTS:  Straight leg raise test: Negative and FABER test: Negative  FUNCTIONAL TESTS:  5 times sit to stand: 11.94; 06/20/24- 9.5 seconds  Timed up and go (TUG): 9.87  GAIT: Distance walked: no limitations, normal gait   TREATMENT  [facility] Adult PT Treatment:  Date:  06/20/24  Nustep L4x8 minutes  for w/u seat 9  Goals, ODI, MMT, 5xSTS  Discussed findings and POC- reasoning for going on hold pending MD appt and potential MRI          06/17/24  Nustep L4x6 minutes BLEs only for warm up, seat 9  Lumbar rotation stretch 5x3 seconds B DKTC 2x20 seconds Figure 4 stretch 1x40 seconds B Hooklying TA set 10x3 seconds  Hooklying alternating UE/LE flexion x20 + TA set HS stretch 1x30 seconds B Hip hikes x10 B (but painful) L-stretch at counter 3x30 seconds  Green TB rows + TA set x12 Green TB shoulder extensions + TA set x12     06/13/24  Nustep L4x6 minutes BLEs only for w/u   PPT x12 cues to avoid arch Bridge x12  Figure 4 stretch 2x30 seconds B  Seated TA set + march x10 B Double checked plank form- generally looked good  QL stretch 3 way x30 seconds each  Demonstrated and educated on tennis ball MFR release on wall       06/04/24 Pt seen for aquatic therapy today.  Treatment took place in water 3.5-4.75 ft in depth at the [facility] pool. Temp of water was 91.  Pt entered/exited the pool via stairs with bil rail.  - unsupported walking forward/ backward with cues for vertical trunk and even step length  - unsupported side stepping -> with arm add/abdct with rainbow hand float ->yellow (improved tolerance) - TrA set with solid black noodle pull down to thighs in staggered stance  - UE on black noodle:   hip add/abd x10 (cues to decrease height of LE) ; hip flexion /extension x10 - at bench in water: side plank R/L  - superman to/from plank on yellow hand floats;  flies to/from push ups in plank position with yellow hand floats - wall push up and off x 15  Pt requires the buoyancy and hydrostatic pressure of water for support, and to offload joints by unweighting joint load by at least 50 % in navel deep water and by at least 75-80% in chest to neck deep water.  Viscosity of the water is needed for resistance of strengthening. Water current  perturbations provides challenge to standing balance requiring increased core activation.     PATIENT EDUCATION:  Education details: intro to aquatic therapy Person educated: Patient Education method: Explanation Education comprehension: verbalized understanding  HOME EXERCISE PROGRAM: Pt indep with person exercise program  AQUATIC Access Code: [email or web address] URL: [email or web address] Date: 06/04/2024  Prepared by: [facility] - Outpatient Re[facility] - [address] This aquatic home exercise program from [employer] utilizes pictures from land based exercises, but has been adapted prior to lamination and issuance.    Access Code: [email or web address] URL: [email or web address] Date: 06/13/2024 Prepared by: Josette Rough  Exercises - Clamshell with Resistance  - 1 x daily - 5 x weekly - 1-2 sets - 10 reps - Supine Posterior Pelvic Tilt  - 1 x daily - 5 x weekly - 1-2 sets - 10 reps - Supine Figure 4 Piriformis Stretch  - 1 x daily - 5 x weekly - 1-2 sets - 4 reps - 30 seconds  hold - TA Bracing + March   - 1 x daily - 5 x weekly - 1-2 sets - 10 reps  ASSESSMENT:  CLINICAL IMPRESSION:   Arrives today doing OK, had a lot of pain from lumbar rotation stretches. We warmed up on the Nustep  then focused on updating ODI as  well as measures and goals. Per pt, he continues to have quite a bit of pain in his back after general exercise and PT. Most relieving factors have been massage belt and situating himself in a good position over whirlpool jet here. He does not report significant change thus far- I think at this point it would be to his most benefit for us  to hold PT until he sees MD/potentially gets MRI for additional information that will help to guide POC. Will place him on hold pending further information from MD/medical care team.     From initial evaluation:  Patient is a 63 y.o. m who was seen today for physical therapy evaluation and treatment for LBP with a 34d  levoscoliosis and lumbar spodylosis. He reports he has known about the scoliosis for years but has not had any therapy for it.  Pain for >10 years with an exacerbation over the past 3.  He is an active exerciser using his personal gym at home 3 days a week working core and LE. He reports avoiding leg lifts as his LB pops.  Has  an inversion table but doesn't use as it is painful. Main compliant is he has increased pain with lying down and sleeping.  He is able to tolerate all activities standing and sitting working through pain.  Wears a weight lifting belt when he is working out in the yard and a corset when he sleeps. Pt goals are to impair the progression of the scoliosis, tighten core and reduce pain.  He will benefit from a short episode of skilled PT with combination of aquatics and land. Aquatic for basis exercises/stretching and to instruct on using the properties of water for pain management. Land for progressive stretching and exercises program to reach stated goals.  OBJECTIVE IMPAIRMENTS: decreased strength, postural dysfunction, and pain.   ACTIVITY LIMITATIONS: sleeping  PARTICIPATION LIMITATIONS: riding lawnmower  REHAB POTENTIAL: Good  CLINICAL DECISION MAKING: Stable/uncomplicated  EVALUATION COMPLEXITY: Low   GOALS: Goals reviewed with patient? Yes  SHORT TERM GOALS: Target date: 06/09/24  Pt to be indep with aquatic exercises/stretching an positioning for pain management Baseline: Goal status: MET - 06/11/24  2.  Pt will consider gaining pool access for use of the properties of water for chronic conditions maintaining mobility and minimizing pain. Baseline:  Goal status: MET 06/20/24 plans to get [employer] membership for pool access/water HEP       LONG TERM GOALS: Target date: 07/20/23  Pt to improve on ODI by 4% (7/50) to demonstrate improvement in function particularly with sleeping Baseline: 9/50=18% Goal status: MET 06/20/24  2.  Pt will be indep with final  HEP for management of progressive scoliosis Baseline:  Goal status: ONGOING 06/20/24  3.  Pt will report improved ability to tolerate lying for improved sleep Baseline:  Goal status: ONGOING 06/20/24  4.  Pt will demonstrate mindfulness of his spinal position to improve alignment encouraging improved posture. Baseline:  Goal status: ONGOING 06/20/24  5.  Pt will report decrease in pain by at least 50% for improved toleration to activity/quality of life and to demonstrate improved management of pain. Baseline:  Goal status: ONGOING 06/20/24    PLAN:  PT FREQUENCY: 1-2x/week  PT DURATION: 8 weeks   PLANNED INTERVENTIONS: 97164- PT Re-evaluation, 97750- Physical Performance Testing, 97110-Therapeutic exercises, 97530- Therapeutic activity, [id number]- Neuromuscular re-education, 97535- Self Care, 02859- Manual therapy, [id number]- Gait training, [phone]- Aquatic Therapy, [phone] (1-2 muscles), 20561 (3+ muscles)- Dry Needling, Patient/Family education, Balance training, Stair  training, Taping, Joint mobilization, DME instructions, Cryotherapy, and Moist heat.  PLAN FOR NEXT SESSION:  On hold pending MD visit and potential MRI    Josette Rough, PT, DPT 06/20/2024 3:07 PM

## 2024-06-25 ENCOUNTER — Encounter (HOSPITAL_BASED_OUTPATIENT_CLINIC_OR_DEPARTMENT_OTHER): Admitting: Physical Therapy

## 2024-06-27 ENCOUNTER — Encounter (HOSPITAL_BASED_OUTPATIENT_CLINIC_OR_DEPARTMENT_OTHER): Admitting: Physical Therapy

## 2024-07-02 ENCOUNTER — Encounter (HOSPITAL_BASED_OUTPATIENT_CLINIC_OR_DEPARTMENT_OTHER): Admitting: Physical Therapy

## 2024-07-05 ENCOUNTER — Encounter (HOSPITAL_BASED_OUTPATIENT_CLINIC_OR_DEPARTMENT_OTHER): Admitting: Physical Therapy

## 2024-07-08 ENCOUNTER — Encounter (HOSPITAL_BASED_OUTPATIENT_CLINIC_OR_DEPARTMENT_OTHER): Admitting: Physical Therapy

## 2024-07-10 ENCOUNTER — Encounter (HOSPITAL_BASED_OUTPATIENT_CLINIC_OR_DEPARTMENT_OTHER): Admitting: Physical Therapy
